# Patient Record
Sex: Male | Born: 1955 | Race: Black or African American | Hispanic: No | Marital: Single | Smoking: Never smoker
Health system: Southern US, Community
[De-identification: ages and names within clinical notes are randomized; demographics above are authoritative.]

## PROBLEM LIST (undated history)

## (undated) DIAGNOSIS — Z95810 Presence of automatic (implantable) cardiac defibrillator: Secondary | ICD-10-CM

---

## 2019-06-30 ENCOUNTER — Ambulatory Visit (INDEPENDENT_AMBULATORY_CARE_PROVIDER_SITE_OTHER): Payer: TRICARE Prime—HMO | Admitting: Clinical Cardiac Electrophysiology

## 2019-06-30 ENCOUNTER — Encounter (INDEPENDENT_AMBULATORY_CARE_PROVIDER_SITE_OTHER): Payer: Self-pay | Admitting: Clinical Cardiac Electrophysiology

## 2019-06-30 VITALS — BP 121/83 | HR 75 | Ht 73.0 in | Wt 250.0 lb

## 2019-06-30 DIAGNOSIS — I469 Cardiac arrest, cause unspecified: Secondary | ICD-10-CM | POA: Insufficient documentation

## 2019-06-30 DIAGNOSIS — I480 Paroxysmal atrial fibrillation: Secondary | ICD-10-CM

## 2019-06-30 DIAGNOSIS — Z79899 Other long term (current) drug therapy: Secondary | ICD-10-CM

## 2019-06-30 DIAGNOSIS — I4891 Unspecified atrial fibrillation: Secondary | ICD-10-CM | POA: Insufficient documentation

## 2019-06-30 DIAGNOSIS — I493 Ventricular premature depolarization: Secondary | ICD-10-CM

## 2019-06-30 DIAGNOSIS — I428 Other cardiomyopathies: Secondary | ICD-10-CM

## 2019-06-30 DIAGNOSIS — Z95810 Presence of automatic (implantable) cardiac defibrillator: Secondary | ICD-10-CM

## 2019-06-30 DIAGNOSIS — Z9229 Personal history of other drug therapy: Secondary | ICD-10-CM

## 2019-06-30 LAB — ECG 12-LEAD
Atrial Rate: 70 {beats}/min
P Axis: 88 degrees
P-R Interval: 198 ms
Q-T Interval: 408 ms
QRS Duration: 94 ms
QTC Calculation (Bezet): 440 ms
R Axis: 60 degrees
T Axis: 9 degrees
Ventricular Rate: 70 {beats}/min
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
## 2019-07-01 ENCOUNTER — Telehealth (INDEPENDENT_AMBULATORY_CARE_PROVIDER_SITE_OTHER): Payer: Self-pay | Admitting: Clinical Cardiac Electrophysiology

## 2019-07-01 DIAGNOSIS — Z79899 Other long term (current) drug therapy: Secondary | ICD-10-CM

## 2019-07-01 NOTE — Telephone Encounter (Signed)
Carollee Herter, please send patient order for PFT testing.

## 2019-07-01 NOTE — Telephone Encounter (Signed)
Placed order. Referral request in DL's bin to sign.

## 2019-07-01 NOTE — Telephone Encounter (Signed)
-----   Message from Tod Persia, MD sent at 06/30/2019  5:39 PM EDT -----  Shawna Orleans: Please send him request for PFTs, amiodarone surveillance  Teddy: please enroll him in remotes.  He is currently followed at [facility].

## 2019-07-01 NOTE — Progress Notes (Signed)
Forwarded it to [employer]. I have never ordered PFT testing.

## 2019-07-02 ENCOUNTER — Encounter (INDEPENDENT_AMBULATORY_CARE_PROVIDER_SITE_OTHER): Payer: Self-pay | Admitting: Clinical Cardiac Electrophysiology

## 2019-07-02 NOTE — Telephone Encounter (Signed)
Faxed and mailed PFT.

## 2019-07-03 ENCOUNTER — Encounter (INDEPENDENT_AMBULATORY_CARE_PROVIDER_SITE_OTHER): Payer: Self-pay

## 2019-07-03 NOTE — Progress Notes (Signed)
He saw Dr. Lujean Amel for a Consult on 06/30/19. I submitted a [facility] transfer request to [facility] today.

## 2019-07-07 ENCOUNTER — Encounter (INDEPENDENT_AMBULATORY_CARE_PROVIDER_SITE_OTHER): Payer: Self-pay | Admitting: Clinical Cardiac Electrophysiology

## 2019-07-11 NOTE — Progress Notes (Signed)
Spoke with the pm clinic and they said they'll accept the transfer.

## 2019-07-14 NOTE — Progress Notes (Signed)
Transfer accepted.

## 2019-09-27 ENCOUNTER — Encounter (INDEPENDENT_AMBULATORY_CARE_PROVIDER_SITE_OTHER): Payer: Self-pay | Admitting: Clinical Cardiac Electrophysiology

## 2019-10-06 ENCOUNTER — Ambulatory Visit (INDEPENDENT_AMBULATORY_CARE_PROVIDER_SITE_OTHER): Payer: TRICARE Prime—HMO | Admitting: Clinical Cardiac Electrophysiology

## 2019-11-20 ENCOUNTER — Ambulatory Visit (INDEPENDENT_AMBULATORY_CARE_PROVIDER_SITE_OTHER): Payer: TRICARE Prime—HMO | Admitting: Clinical Cardiac Electrophysiology

## 2019-12-08 ENCOUNTER — Encounter (INDEPENDENT_AMBULATORY_CARE_PROVIDER_SITE_OTHER): Payer: Self-pay | Admitting: Clinical Cardiac Electrophysiology

## 2019-12-25 ENCOUNTER — Telehealth (INDEPENDENT_AMBULATORY_CARE_PROVIDER_SITE_OTHER): Payer: TRICARE Prime—HMO | Admitting: Clinical Cardiac Electrophysiology

## 2019-12-25 ENCOUNTER — Encounter (INDEPENDENT_AMBULATORY_CARE_PROVIDER_SITE_OTHER): Payer: Self-pay | Admitting: Clinical Cardiac Electrophysiology

## 2019-12-25 VITALS — Wt 251.0 lb

## 2019-12-25 DIAGNOSIS — Z4502 Encounter for adjustment and management of automatic implantable cardiac defibrillator: Secondary | ICD-10-CM

## 2019-12-25 DIAGNOSIS — I469 Cardiac arrest, cause unspecified: Secondary | ICD-10-CM

## 2019-12-25 DIAGNOSIS — Z79899 Other long term (current) drug therapy: Secondary | ICD-10-CM

## 2019-12-25 DIAGNOSIS — Z95810 Presence of automatic (implantable) cardiac defibrillator: Secondary | ICD-10-CM

## 2019-12-25 DIAGNOSIS — Z9229 Personal history of other drug therapy: Secondary | ICD-10-CM

## 2019-12-25 DIAGNOSIS — I428 Other cardiomyopathies: Secondary | ICD-10-CM

## 2019-12-25 DIAGNOSIS — I480 Paroxysmal atrial fibrillation: Secondary | ICD-10-CM

## 2019-12-25 MED ORDER — AMIODARONE HCL 200 MG PO TABS
100.00 mg | ORAL_TABLET | Freq: Every day | ORAL | 3 refills | Status: AC
Start: 2019-12-25 — End: ?
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
## 2020-01-05 ENCOUNTER — Encounter (INDEPENDENT_AMBULATORY_CARE_PROVIDER_SITE_OTHER): Payer: Self-pay

## 2020-01-05 NOTE — Progress Notes (Signed)
Lab orders mailed to patient at address listed in chart

## 2020-01-19 ENCOUNTER — Other Ambulatory Visit (FREE_STANDING_LABORATORY_FACILITY): Payer: TRICARE Prime—HMO

## 2020-01-19 DIAGNOSIS — Z79899 Other long term (current) drug therapy: Secondary | ICD-10-CM

## 2020-01-19 LAB — COMPREHENSIVE METABOLIC PANEL
ALT: 33 U/L (ref 0–55)
AST (SGOT): 32 U/L (ref 5–34)
Albumin/Globulin Ratio: 1.2 (ref 0.9–2.2)
Albumin: 3.8 g/dL (ref 3.5–5.0)
Alkaline Phosphatase: 94 U/L (ref 38–106)
Anion Gap: 10 (ref 5.0–15.0)
BUN: 24 mg/dL (ref 9.0–28.0)
Bilirubin, Total: 0.8 mg/dL (ref 0.2–1.2)
CO2: 24 mEq/L (ref 21–29)
Calcium: 9.2 mg/dL (ref 8.5–10.5)
Chloride: 107 mEq/L (ref 100–111)
Creatinine: 1.4 mg/dL (ref 0.5–1.5)
Globulin: 3.3 g/dL (ref 2.0–3.7)
Glucose: 91 mg/dL (ref 70–100)
Potassium: 4.2 mEq/L (ref 3.5–5.1)
Protein, Total: 7.1 g/dL (ref 6.0–8.3)
Sodium: 141 mEq/L (ref 136–145)

## 2020-01-19 LAB — T4, FREE: T4 Free: 1.13 ng/dL (ref 0.70–1.48)

## 2020-01-19 LAB — TSH: TSH: 1.83 u[IU]/mL (ref 0.35–4.94)

## 2020-01-19 LAB — GFR: EGFR: 51.5

## 2020-01-19 LAB — HEMOLYSIS INDEX: Hemolysis Index: 18 (ref 0–18)

## 2020-01-23 ENCOUNTER — Telehealth (INDEPENDENT_AMBULATORY_CARE_PROVIDER_SITE_OTHER): Payer: Self-pay

## 2020-01-23 NOTE — Telephone Encounter (Signed)
Patient notified MW reviewed labs, wnl.

## 2020-01-23 NOTE — Telephone Encounter (Signed)
-----   Message from [facility], [place] sent at 01/21/2020 10:07 AM EST -----  On Amio, dose decreased 12/25/19 to 100 mg daily.

## 2020-03-30 ENCOUNTER — Encounter (INDEPENDENT_AMBULATORY_CARE_PROVIDER_SITE_OTHER): Payer: TRICARE Prime—HMO

## 2020-04-15 DIAGNOSIS — Z95810 Presence of automatic (implantable) cardiac defibrillator: Secondary | ICD-10-CM

## 2020-04-25 ENCOUNTER — Encounter (INDEPENDENT_AMBULATORY_CARE_PROVIDER_SITE_OTHER): Payer: Self-pay | Admitting: Cardiovascular Disease

## 2020-05-16 ENCOUNTER — Encounter (INDEPENDENT_AMBULATORY_CARE_PROVIDER_SITE_OTHER): Payer: Self-pay | Admitting: Clinical Cardiac Electrophysiology

## 2020-05-18 ENCOUNTER — Telehealth (INDEPENDENT_AMBULATORY_CARE_PROVIDER_SITE_OTHER): Payer: Self-pay

## 2020-05-18 NOTE — Telephone Encounter (Signed)
Pt Sean Mason wanting to let MW know that he was currently in ER at [facility] awaiting work up/triage for spike in his BP and L arm and L neck pain. His BP has normalized since his arrival. He will update [place] on any follow up or changes to plan of care recommended on discharge.     Sending to MW as Lorain Childes per patient request.

## 2020-05-19 NOTE — Telephone Encounter (Signed)
Remote download came through. He's only had 1 brief run of VT since his telemed visit on 12/25/19. He is having PVCs on the current EGM which is probably what he's feeling. Would we want to increase his Amio for this?    Since he was just in the ED for BP issues probably better to wait until his Aug office visit for any changes right?    See Murj.

## 2020-05-19 NOTE — Telephone Encounter (Signed)
5 beats nonsustained VT only.  Routine follow-up.  mw

## 2020-06-24 ENCOUNTER — Encounter (INDEPENDENT_AMBULATORY_CARE_PROVIDER_SITE_OTHER): Payer: TRICARE Prime—HMO | Admitting: Clinical Cardiac Electrophysiology

## 2020-07-15 DIAGNOSIS — Z95810 Presence of automatic (implantable) cardiac defibrillator: Secondary | ICD-10-CM

## 2020-07-24 ENCOUNTER — Encounter (INDEPENDENT_AMBULATORY_CARE_PROVIDER_SITE_OTHER): Payer: Self-pay | Admitting: Clinical Cardiac Electrophysiology

## 2020-07-30 ENCOUNTER — Other Ambulatory Visit (INDEPENDENT_AMBULATORY_CARE_PROVIDER_SITE_OTHER): Payer: Self-pay

## 2020-07-30 ENCOUNTER — Encounter (INDEPENDENT_AMBULATORY_CARE_PROVIDER_SITE_OTHER): Payer: Self-pay | Admitting: Clinical Cardiac Electrophysiology

## 2020-07-30 DIAGNOSIS — I480 Paroxysmal atrial fibrillation: Secondary | ICD-10-CM

## 2020-07-30 MED ORDER — CARVEDILOL 25 MG PO TABS
25.00 mg | ORAL_TABLET | Freq: Two times a day (BID) | ORAL | 3 refills | Status: AC
Start: 2020-07-30 — End: ?

## 2020-07-30 MED ORDER — RIVAROXABAN 20 MG PO TABS
20.0000 mg | ORAL_TABLET | Freq: Every day | ORAL | 3 refills | Status: AC
Start: 2020-07-30 — End: ?

## 2020-09-15 ENCOUNTER — Encounter (INDEPENDENT_AMBULATORY_CARE_PROVIDER_SITE_OTHER): Payer: TRICARE Prime—HMO | Admitting: Clinical Cardiac Electrophysiology

## 2021-01-14 ENCOUNTER — Telehealth (INDEPENDENT_AMBULATORY_CARE_PROVIDER_SITE_OTHER): Payer: Self-pay

## 2021-01-14 NOTE — Telephone Encounter (Signed)
Transfer-out accepted today.

## 2021-01-14 NOTE — Telephone Encounter (Signed)
LM that his carelink remotes are still coming to [place]. He cancelled his Nov 2021 office visit saying he is transferring his care to [facility]. [facility]. I instructed him to contact them so they can put in a carelink transfer request.

## 2021-09-02 ENCOUNTER — Other Ambulatory Visit: Payer: Self-pay

## 2021-09-02 ENCOUNTER — Observation Stay (HOSPITAL_COMMUNITY)
Admission: EM | Admit: 2021-09-02 | Discharge: 2021-09-06 | Disposition: A | Discharge status: Home / Self Care | Attending: Internal Medicine | Admitting: Internal Medicine

## 2021-09-02 ENCOUNTER — Emergency Department (HOSPITAL_COMMUNITY)

## 2021-09-02 ENCOUNTER — Encounter (HOSPITAL_COMMUNITY): Payer: Self-pay | Admitting: Emergency Medicine

## 2021-09-02 DIAGNOSIS — G459 Transient cerebral ischemic attack, unspecified: Principal | ICD-10-CM | POA: Insufficient documentation

## 2021-09-02 DIAGNOSIS — E785 Hyperlipidemia, unspecified: Secondary | ICD-10-CM

## 2021-09-02 DIAGNOSIS — Z950 Presence of cardiac pacemaker: Secondary | ICD-10-CM

## 2021-09-02 DIAGNOSIS — Z7901 Long term (current) use of anticoagulants: Secondary | ICD-10-CM | POA: Diagnosis not present

## 2021-09-02 DIAGNOSIS — Z79899 Other long term (current) drug therapy: Secondary | ICD-10-CM | POA: Diagnosis not present

## 2021-09-02 DIAGNOSIS — I10 Essential (primary) hypertension: Secondary | ICD-10-CM

## 2021-09-02 DIAGNOSIS — R531 Weakness: Secondary | ICD-10-CM | POA: Diagnosis present

## 2021-09-02 DIAGNOSIS — I5022 Chronic systolic (congestive) heart failure: Secondary | ICD-10-CM | POA: Diagnosis not present

## 2021-09-02 DIAGNOSIS — I110 Hypertensive heart disease with heart failure: Secondary | ICD-10-CM | POA: Diagnosis not present

## 2021-09-02 DIAGNOSIS — I480 Paroxysmal atrial fibrillation: Secondary | ICD-10-CM | POA: Insufficient documentation

## 2021-09-02 DIAGNOSIS — E119 Type 2 diabetes mellitus without complications: Secondary | ICD-10-CM | POA: Diagnosis not present

## 2021-09-02 DIAGNOSIS — Y900 Blood alcohol level of less than 20 mg/100 ml: Secondary | ICD-10-CM | POA: Insufficient documentation

## 2021-09-02 DIAGNOSIS — Z20822 Contact with and (suspected) exposure to covid-19: Secondary | ICD-10-CM | POA: Insufficient documentation

## 2021-09-02 HISTORY — DX: Presence of automatic (implantable) cardiac defibrillator: Z95.810

## 2021-09-02 LAB — URINALYSIS, ROUTINE W REFLEX MICROSCOPIC
Bacteria, UA: NONE SEEN
Bilirubin Urine: NEGATIVE
Glucose, UA: >=500 mg/dL — AB
Hgb urine dipstick: NEGATIVE
Ketones, ur: 5 mg/dL — AB
Leukocytes,Ua: NEGATIVE
Nitrite: NEGATIVE
Protein, ur: NEGATIVE mg/dL
Specific Gravity, Urine: 1.028 (ref 1.005–1.030)
pH: 5 (ref 5.0–8.0)

## 2021-09-02 LAB — CBC
HCT: 49.6 % (ref 39.0–52.0)
Hemoglobin: 16.3 g/dL (ref 13.0–17.0)
MCH: 30.1 pg (ref 26.0–34.0)
MCHC: 32.9 g/dL (ref 30.0–36.0)
MCV: 91.5 fL (ref 80.0–100.0)
Platelets: 171 10*3/uL (ref 150–400)
RBC: 5.42 MIL/uL (ref 4.22–5.81)
RDW: 12.3 % (ref 11.5–15.5)
WBC: 5.3 10*3/uL (ref 4.0–10.5)
nRBC: 0 % (ref 0.0–0.2)

## 2021-09-02 LAB — RAPID URINE DRUG SCREEN, HOSP PERFORMED
Amphetamines: NOT DETECTED
Barbiturates: NOT DETECTED
Benzodiazepines: NOT DETECTED
Cocaine: NOT DETECTED
Opiates: NOT DETECTED
Tetrahydrocannabinol: NOT DETECTED

## 2021-09-02 LAB — DIFFERENTIAL
Abs Immature Granulocytes: 0.01 10*3/uL (ref 0.00–0.07)
Basophils Absolute: 0 10*3/uL (ref 0.0–0.1)
Basophils Relative: 1 %
Eosinophils Absolute: 0.1 10*3/uL (ref 0.0–0.5)
Eosinophils Relative: 1 %
Immature Granulocytes: 0 %
Lymphocytes Relative: 33 %
Lymphs Abs: 1.8 10*3/uL (ref 0.7–4.0)
Monocytes Absolute: 0.6 10*3/uL (ref 0.1–1.0)
Monocytes Relative: 11 %
Neutro Abs: 2.9 10*3/uL (ref 1.7–7.7)
Neutrophils Relative %: 54 %

## 2021-09-02 LAB — COMPREHENSIVE METABOLIC PANEL
ALT: 30 U/L (ref 0–44)
AST: 25 U/L (ref 15–41)
Albumin: 3.7 g/dL (ref 3.5–5.0)
Alkaline Phosphatase: 73 U/L (ref 38–126)
Anion gap: 8 (ref 5–15)
BUN: 22 mg/dL (ref 8–23)
CO2: 25 mmol/L (ref 22–32)
Calcium: 9.9 mg/dL (ref 8.9–10.3)
Chloride: 104 mmol/L (ref 98–111)
Creatinine, Ser: 1.29 mg/dL — ABNORMAL HIGH (ref 0.61–1.24)
GFR, Estimated: >60 mL/min (ref >60)
Glucose, Bld: 107 mg/dL — ABNORMAL HIGH (ref 70–99)
Potassium: 4.2 mmol/L (ref 3.5–5.1)
Sodium: 137 mmol/L (ref 135–145)
Total Bilirubin: 1.3 mg/dL — ABNORMAL HIGH (ref 0.3–1.2)
Total Protein: 6.9 g/dL (ref 6.5–8.1)

## 2021-09-02 LAB — PROTIME-INR
INR: 1.3 — ABNORMAL HIGH (ref 0.8–1.2)
Prothrombin Time: 16.2 seconds — ABNORMAL HIGH (ref 11.4–15.2)

## 2021-09-02 LAB — I-STAT CHEM 8, ED
BUN: 24 mg/dL — ABNORMAL HIGH (ref 8–23)
Calcium, Ion: 1.24 mmol/L (ref 1.15–1.40)
Chloride: 105 mmol/L (ref 98–111)
Creatinine, Ser: 1.1 mg/dL (ref 0.61–1.24)
Glucose, Bld: 94 mg/dL (ref 70–99)
HCT: 49 % (ref 39.0–52.0)
Hemoglobin: 16.7 g/dL (ref 13.0–17.0)
Potassium: 4.1 mmol/L (ref 3.5–5.1)
Sodium: 140 mmol/L (ref 135–145)
TCO2: 26 mmol/L (ref 22–32)

## 2021-09-02 LAB — RESP PANEL BY RT-PCR (FLU A&B, COVID) ARPGX2
Influenza A by PCR: NEGATIVE
Influenza B by PCR: NEGATIVE
SARS Coronavirus 2 by RT PCR: NEGATIVE

## 2021-09-02 LAB — CBG MONITORING, ED: Glucose-Capillary: 109 mg/dL — ABNORMAL HIGH (ref 70–99)

## 2021-09-02 LAB — ETHANOL: Alcohol, Ethyl (B): <10 mg/dL (ref <10)

## 2021-09-02 LAB — APTT: aPTT: 28 seconds (ref 24–36)

## 2021-09-02 IMAGING — CT CT HEAD W/O CM
4 series · 16 of 47 positions shown, 18 images · non-contrast
Comparison: None.

CLINICAL DATA: Neuro deficit, acute, stroke suspected

Left-sided body weakness.  Confusion and dizziness.
EXAM:
CT HEAD WITHOUT CONTRAST
TECHNIQUE: Contiguous axial images were obtained from the base of the skull
through the vertex without intravenous contrast.

[Series 3: head without · axial · non-contrast · 0.47mm/px · z∈[-138,-8]mm · 7 of 36 slices shown, 9 images]
[im 5/36  brain]
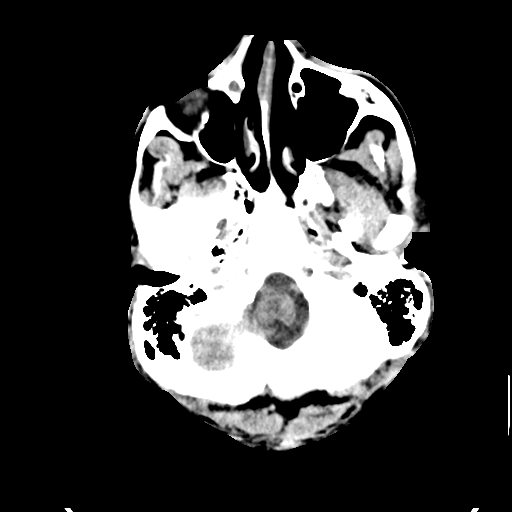
[im 5/36  bone]
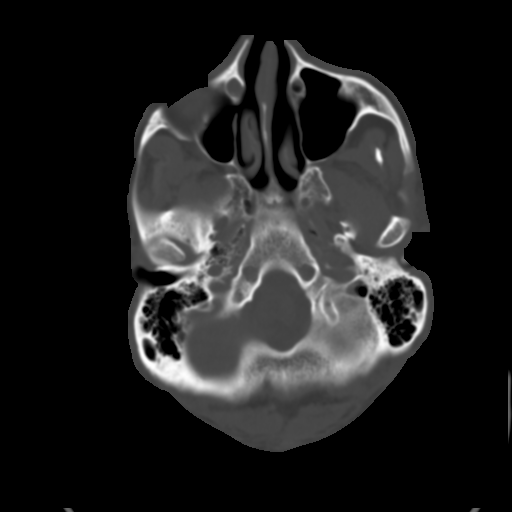
[im 9/36  brain]
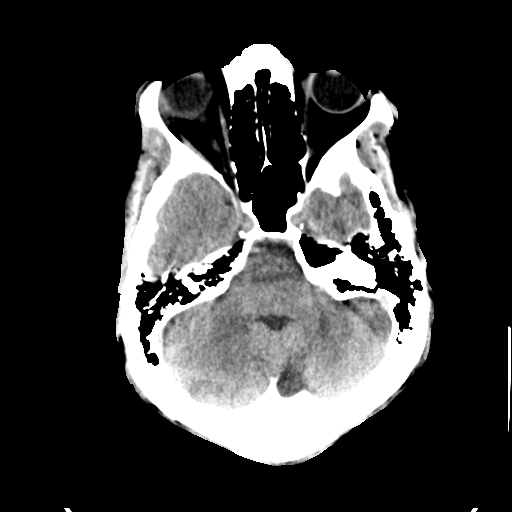
[im 14/36  brain]
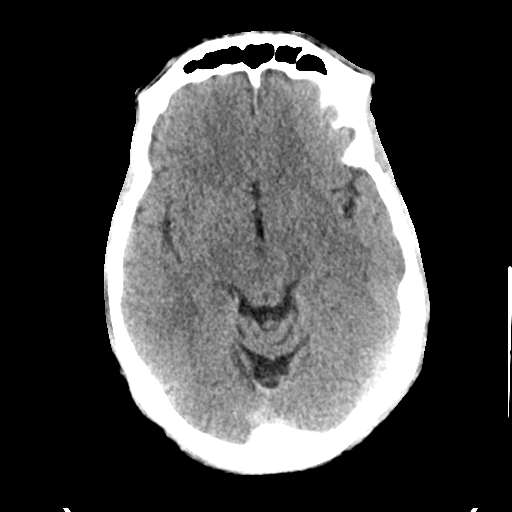
[im 18/36  brain]
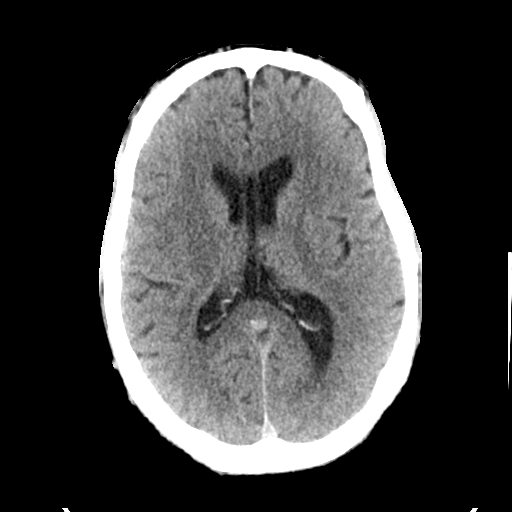
[im 22/36  brain]
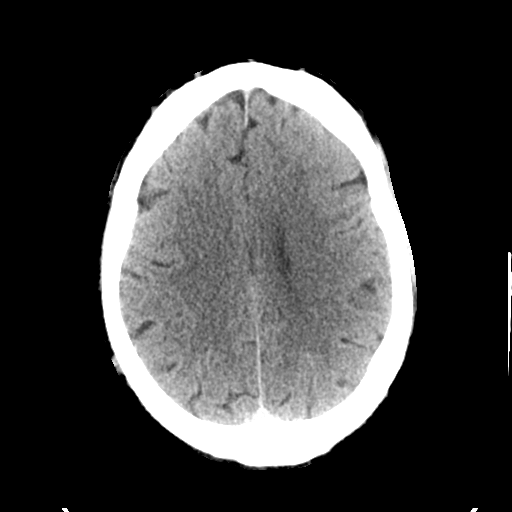
[im 22/36  bone]
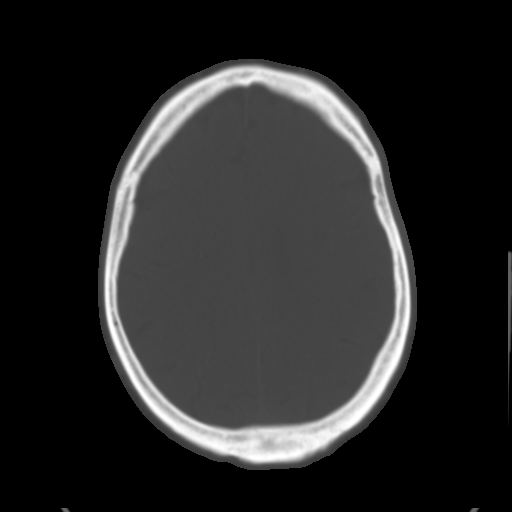
[im 27/36  brain]
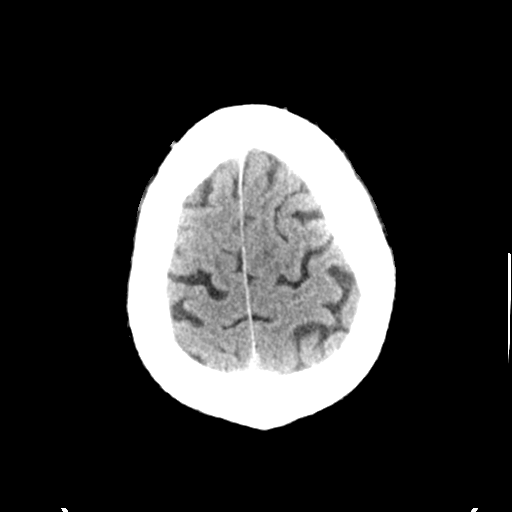
[im 31/36  brain]
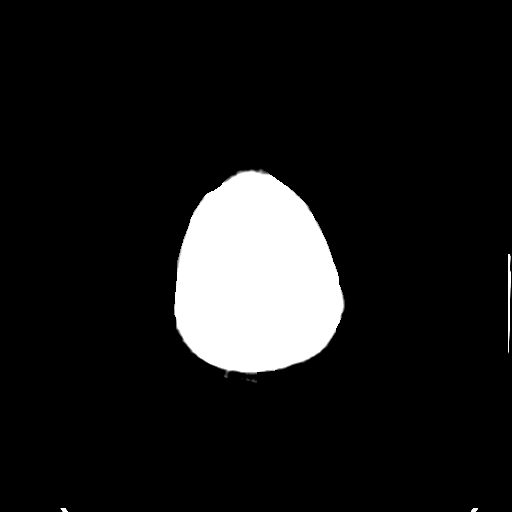

[Series 4: head bone · axial · 0.47mm/px · z∈[-142,-106]mm · 3 of 89 slices shown]
[im 9/89  bone]
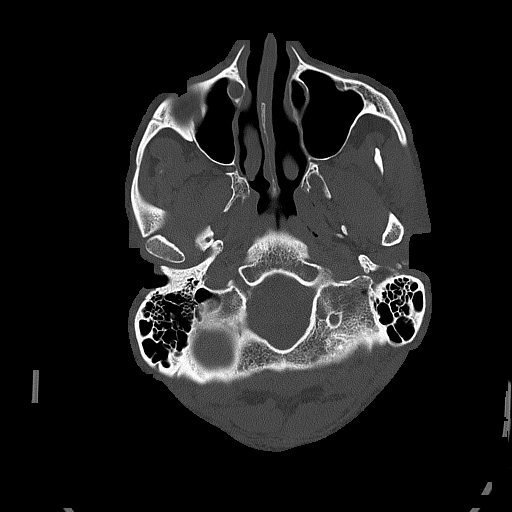
[im 18/89  bone]
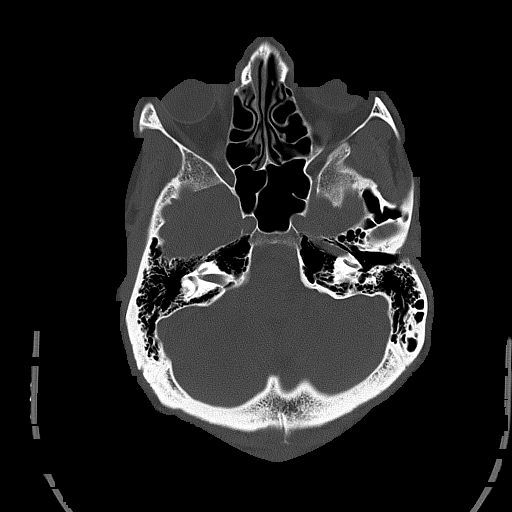
[im 27/89  bone]
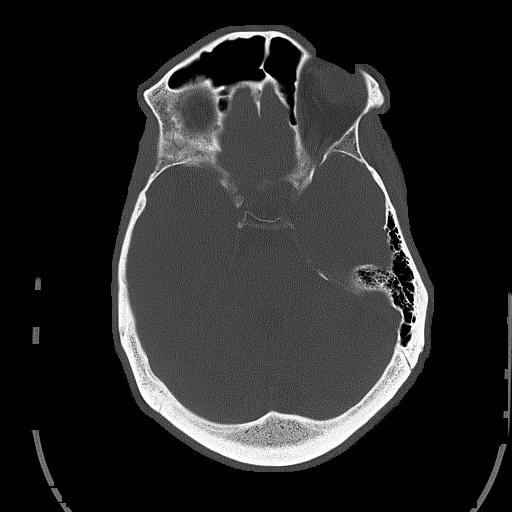

[Series 5: head without cor · coronal · non-contrast · 0.34mm/px · 3 of 80 slices shown]
[im 27/80  brain]
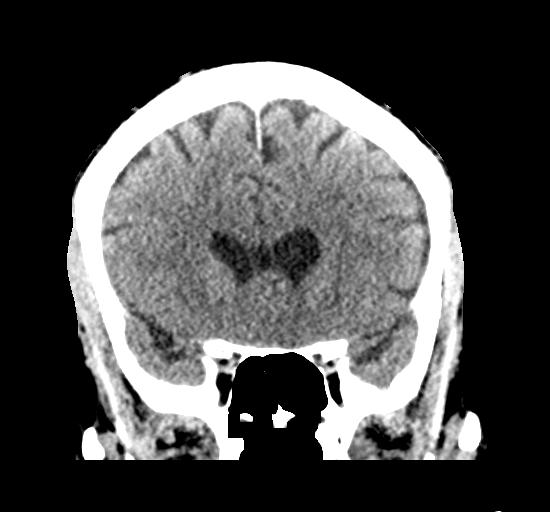
[im 36/80  brain]
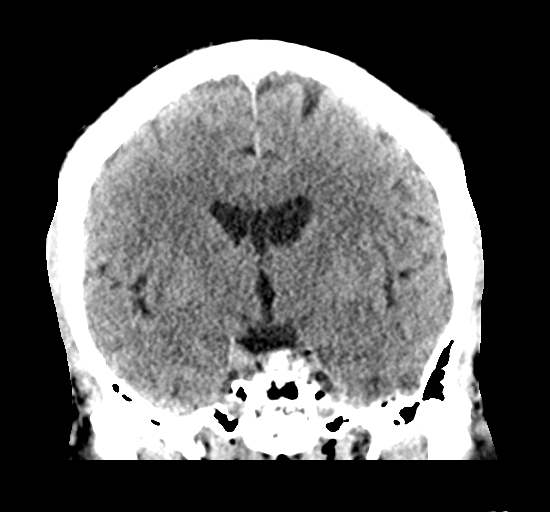
[im 44/80  brain]
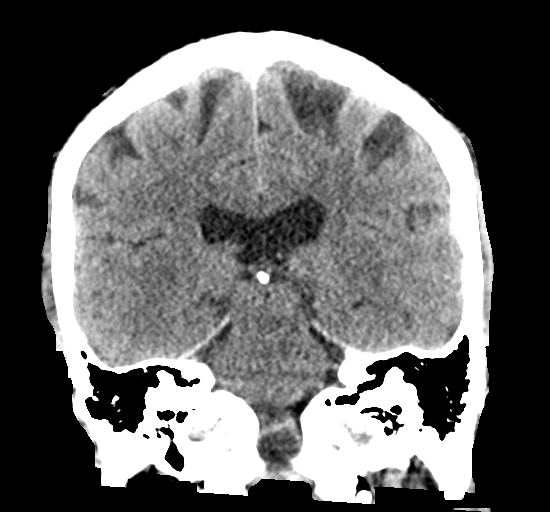

[Series 6: head without sag · sagittal · non-contrast · 0.34mm/px · 3 of 64 slices shown]
[im 22/64  brain]
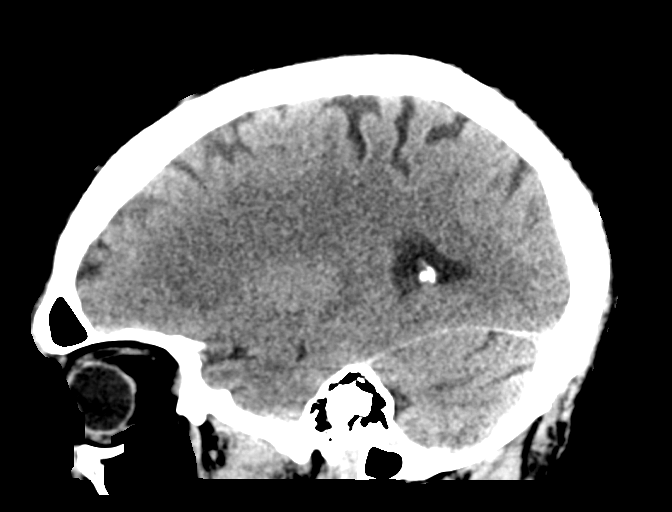
[im 32/64  brain]
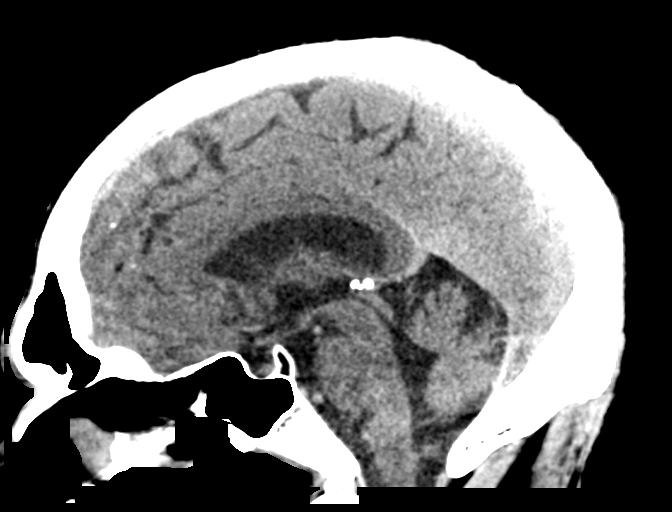
[im 43/64  brain]
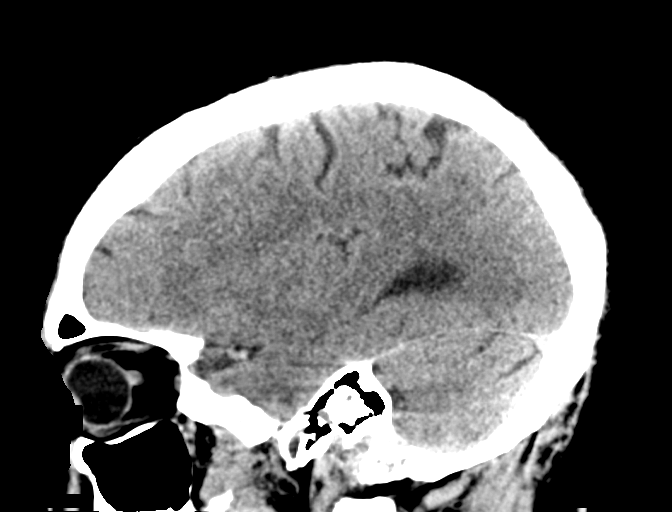

[16 of 47 positions shown; findings below may reference images not displayed]

FINDINGS: Brain: No intracranial hemorrhage, mass effect, or midline shift. No
hydrocephalus. Incidental cavum septum pellucidum, normal variant.
The basilar cisterns are patent. No evidence of territorial infarct
or acute ischemia. No extra-axial or intracranial fluid collection.

Vascular: No hyperdense vessel or unexpected calcification.

Skull: Normal. Negative for fracture or focal lesion.

Sinuses/Orbits: Paranasal sinuses and mastoid air cells are clear.
The visualized orbits are unremarkable.

Other: None.
IMPRESSION: Negative noncontrast head CT.

## 2021-09-02 MED ORDER — SODIUM CHLORIDE 0.9 % IV BOLUS
30.0000 mL/kg | Freq: Once | INTRAVENOUS | Status: AC
  Administered 2021-09-03: 3600 mL via INTRAVENOUS

## 2021-09-02 NOTE — H&P (Signed)
History and Physical    Bobby Griffin ZOX:[id number] DOB: 09-24-1956 DOA: 09/02/2021  PCP: Pcp, No Patient coming from: Home  Chief Complaint: Left-sided weakness  HPI: Bobby Griffin is a 65 y.o. male with medical history significant of nonischemic cardiomyopathy status post ICD implantation in 2017, paroxysmal A. 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  Symptom onset 1730 today.  Patient reported that his facial droop was chronic.  He did not have any other focal neurodeficits on exam.  Not a tPA candidate due to being on chronic anticoagulation.  Head CT negative for acute stroke.  Neurology recommended admitting the patient for TIA work-up.  Otherwise, vital signs stable.  Labs showing WBC 5.3, hemoglobin 16.3, platelet count 171k.  Sodium 137, potassium 4.2, chloride 104, bicarb 25, BUN 22, creatinine 1.2, glucose 107.  COVID and influenza PCR negative.  Blood ethanol level undetectable.  UA without signs of infection.  UDS negative.  Patient states he was driving from [place] to [place] and stopped at a store on the way around 5 or 5:30 PM.  All of a sudden he felt disoriented and his left arm felt limp.  He could not grip objects with his left hand and dropped items twice.  His left leg also felt slightly weak at that time.  He thinks his speech was also slightly slurred.  Symptoms started improving soon after and he was able to drive to [place].  Still having numbness in his left hand and having difficulty gripping objects.  He is ambidextrous.  Also the left side of his face feels numb.  Strength in his leg has improved, no numbness.  Speech has improved.  Denies history of prior stroke.  No other complaints.  Denies fevers, cough, shortness of breath, chest pain, nausea, vomiting, abdominal pain, or  diarrhea.  Review of Systems:  All systems reviewed and apart from history of presenting illness, are negative.  Past Medical History:  Diagnosis Date   ICD (implantable cardioverter-defibrillator) in place     History reviewed. No pertinent surgical history.   reports that he has never smoked. He has never used smokeless tobacco. He reports that he does not drink alcohol and does not use drugs.  Allergies  Allergen Reactions   Penicillins     History reviewed. No pertinent family history.  Prior to Admission medications   Medication Sig Start Date End Date Taking? Authorizing Provider  carvedilol (COREG) 25 MG tablet Take 12.5 mg by mouth in the morning and at bedtime. 01/14/20 09/28/21 Yes [provider]  diclofenac Sodium (VOLTAREN) 1 % GEL Apply 2-4 g topically every 4 (four) hours as needed (pain). 06/30/21 06/30/22 Yes [provider]  empagliflozin (JARDIANCE) 10 MG TABS tablet Take 10 mg by mouth in the morning. 06/30/21 06/29/22 Yes [provider]  eplerenone (INSPRA) 25 MG tablet Take 12.5 mg by mouth daily. 01/14/20 06/29/22 Yes [provider]  glucosamine-chondroitin 500-400 MG tablet Take 2 tablets by mouth every evening.   Yes [provider]  Multiple Vitamins-Minerals (MULTIVITAMIN WITH MINERALS) tablet Take 1 tablet by mouth daily.   Yes [provider]  pravastatin (PRAVACHOL) 20 MG tablet Take 20 mg by mouth at bedtime. 06/30/21 06/29/22 Yes [provider]  rivaroxaban (XARELTO) 20 MG TABS tablet Take 20 mg by mouth every evening. 01/14/20 06/29/22 Yes [provider]  sacubitril-valsartan (ENTRESTO) 24-26 MG  Take 1 tablet by mouth 2 (two) times daily. 01/14/20 06/29/22 Yes [provider]  Saline (OCEAN NASAL SPRAY NA) Place 1 spray into both nostrils daily as needed (congestion).   Yes [provider]  sildenafil (VIAGRA) 25 MG tablet Take 25 mg by mouth daily as needed for erectile  dysfunction. 07/05/21 06/30/22 Yes [provider]  tamsulosin (FLOMAX) 0.4 MG CAPS capsule Take 1 capsule by mouth every evening. 01/14/20 10/11/21 Yes [provider]    Physical Exam: Vitals:   09/02/21 2345 09/03/21 0015 09/03/21 0400 09/03/21 0557  BP: 107/87 116/88 103/63 98/71  Pulse: 69 68 77 68  Resp: 17 17 16 19   Temp:   98.5 F (36.9 C)   TempSrc:   Oral   SpO2: 98% 98% 96% 96%  Weight:   112.5 kg   Height:   6\' 1"  (1.854 m)     Physical Exam Constitutional:      General: He is not in acute distress. HENT:     Head: Normocephalic and atraumatic.  Eyes:     Extraocular Movements: Extraocular movements intact.     Conjunctiva/sclera: Conjunctivae normal.  Cardiovascular:     Rate and Rhythm: Normal rate and regular rhythm.     Pulses: Normal pulses.  Pulmonary:     Effort: Pulmonary effort is normal. No respiratory distress.     Breath sounds: Normal breath sounds. No wheezing or rales.  Abdominal:     General: Bowel sounds are normal. There is no distension.     Palpations: Abdomen is soft.     Tenderness: There is no abdominal tenderness.  Musculoskeletal:        General: No swelling or tenderness.     Cervical back: Normal range of motion and neck supple.  Skin:    General: Skin is warm and dry.  Neurological:     Mental Status: He is alert and oriented to person, place, and time.     Comments: Facial asymmetry noted which is chronic per patient Speech fluent Decreased sensation on the left side of his face below forehead Decreased grip strength and sensation in the left hand He is able to raise both arms up against gravity, no pronator drift noted Strength 5 out of 5 in bilateral lower extremities and sensation intact.     Labs on Admission: I have personally reviewed following labs and imaging studies  CBC: Recent Labs  Lab 09/02/21 2153 09/02/21 2212  WBC 5.3  --   NEUTROABS 2.9  --   HGB 16.3 16.7  HCT 49.6 49.0  MCV 91.5  --    PLT 171  --    Basic Metabolic Panel: Recent Labs  Lab 09/02/21 2153 09/02/21 2212  NA 137 140  K 4.2 4.1  CL 104 105  CO2 25  --   GLUCOSE 107* 94  BUN 22 24*  CREATININE 1.29* 1.10  CALCIUM 9.9  --    GFR: Estimated Creatinine Clearance: 91.5 mL/min (by C-G formula based on SCr of 1.1 mg/dL). Liver Function Tests: Recent Labs  Lab 09/02/21 2153  AST 25  ALT 30  ALKPHOS 73  BILITOT 1.3*  PROT 6.9  ALBUMIN 3.7   No results for input(s): LIPASE, AMYLASE in the last 168 hours. No results for input(s): AMMONIA in the last 168 hours. Coagulation Profile: Recent Labs  Lab 09/02/21 2153  INR 1.3*   Cardiac Enzymes: No results for input(s): CKTOTAL, CKMB, CKMBINDEX, TROPONINI in the last 168 hours. BNP (last  3 results) No results for input(s): PROBNP in the last 8760 hours. HbA1C: Recent Labs    09/03/21 0240  HGBA1C 5.2   CBG: Recent Labs  Lab 09/02/21 2130 09/03/21 0703  GLUCAP 109* 133*   Lipid Profile: Recent Labs    09/03/21 0240  CHOL 142  HDL 42  LDLCALC 92  TRIG 40  CHOLHDL 3.4   Thyroid Function Tests: No results for input(s): TSH, T4TOTAL, FREET4, T3FREE, THYROIDAB in the last 72 hours. Anemia Panel: No results for input(s): VITAMINB12, FOLATE, FERRITIN, TIBC, IRON, RETICCTPCT in the last 72 hours. Urine analysis:    Component Value Date/Time   COLORURINE YELLOW 09/02/2021 2201   APPEARANCEUR CLEAR 09/02/2021 2201   LABSPEC 1.028 09/02/2021 2201   PHURINE 5.0 09/02/2021 2201   GLUCOSEU >=500 (A) 09/02/2021 2201   HGBUR NEGATIVE 09/02/2021 2201   BILIRUBINUR NEGATIVE 09/02/2021 2201   KETONESUR 5 (A) 09/02/2021 2201   PROTEINUR NEGATIVE 09/02/2021 2201   NITRITE NEGATIVE 09/02/2021 2201   LEUKOCYTESUR NEGATIVE 09/02/2021 2201    Radiological Exams on Admission: CT HEAD WO CONTRAST  Result Date: 09/02/2021 CLINICAL DATA:  Neuro deficit, acute, stroke suspected Left-sided body weakness.  Confusion and dizziness. EXAM: CT HEAD  WITHOUT CONTRAST TECHNIQUE: Contiguous axial images were obtained from the base of the skull through the vertex without intravenous contrast. COMPARISON:  None. FINDINGS: Brain: No intracranial hemorrhage, mass effect, or midline shift. No hydrocephalus. Incidental cavum septum pellucidum, normal variant. The basilar cisterns are patent. No evidence of territorial infarct or acute ischemia. No extra-axial or intracranial fluid collection. Vascular: No hyperdense vessel or unexpected calcification. Skull: Normal. Negative for fracture or focal lesion. Sinuses/Orbits: Paranasal sinuses and mastoid air cells are clear. The visualized orbits are unremarkable. Other: None. IMPRESSION: Negative noncontrast head CT. Electronically Signed   By: Narda Rutherford M.D.   On: 09/02/2021 22:26    EKG: Independently reviewed.  Sinus rhythm with first-degree AV block, PVCs, borderline T wave abnormalities in lateral leads.  No prior tracing for comparison.  Assessment/Plan Principal Problem:   TIA (transient ischemic attack) Active Problems:   Chronic systolic CHF (congestive heart failure) (HCC)   AF (paroxysmal atrial fibrillation) (HCC)   Essential hypertension   Hyperlipidemia   TIA versus possible stroke Head CT negative.  A1c 5.2.  Currently on low intensity statin, LDL 92. -Neurology consulted, appreciate recommendations -Telemetry monitoring -MRI of the brain without contrast -MRA head and neck -TTE -Discontinue pravastatin.  Start atorvastatin 40 mg daily. -Neurology recommending continuing Xarelto -SBP goal <180 -Frequent neurochecks -PT, OT, speech therapy. -N.p.o. until cleared by bedside swallow evaluation or formal speech evaluation.  Chronic systolic CHF/ nonischemic cardiomyopathy Status post ICD implantation in 2017.  Echo done June 2020 showing EF 30-35%.  No signs of volume overload at this time. -Hold Coreg at this time as blood pressure is soft  Paroxysmal A. fib/flutter Stable.   Currently in sinus rhythm. -Hold Coreg at this time as blood pressure is soft.  Continue Xarelto.  EKG abnormality, elevated troponin EKG showing borderline T wave abnormalities in lateral leads, no prior tracing for comparison.  High-sensitivity troponin 25.  ACS less likely as patient is not endorsing any chest pain. -Trend troponin  Hypertension -Hold antihypertensives at this time as blood pressure soft  Hyperlipidemia -Started on high intensity statin  Non-insulin-dependent type 2 diabetes Well-controlled-A1c 5.2. 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  No family at bedside. Disposition Plan: Status is: Observation  The patient remains OBS appropriate and will d/c before 2 midnights.  Level of care: Level of care: Telemetry Medical  The medical decision making on this patient was of high complexity and the patient is at high risk for clinical deterioration, therefore this is a level 3 visit.  John Giovanni MD [facility]  If 7PM-7AM, please contact night-coverage [email or web address]  09/03/2021, 8:00 AM

## 2021-09-02 NOTE — ED Provider Notes (Addendum)
[facility] EMERGENCY DEPARTMENT Provider Note   CSN: [id number] Arrival date & time: 09/02/21  1938     History Chief Complaint  Patient presents with   Left Side Body Weakness    Bobby Griffin is a 65 y.o. male.  The history is provided by the patient.  Extremity Weakness This is a new problem. The current episode started 12 to 24 hours ago. The problem occurs constantly. The problem has not changed since onset.Pertinent negatives include no chest pain and no shortness of breath. Nothing aggravates the symptoms. Nothing relieves the symptoms. He has tried nothing for the symptoms. The treatment provided no relief.  Driving from [place] to [place] and became dizzy confused facial weakness and LUE weakness.  Some changes in speech.      Past Medical History:  Diagnosis Date   ICD (implantable cardioverter-defibrillator) in place     There are no problems to display for this patient.   History reviewed. No pertinent surgical history.     History reviewed. No pertinent family history.  Social History   Tobacco Use   Smoking status: Never   Smokeless tobacco: Never  Substance Use Topics   Alcohol use: Never   Drug use: Never    Home Medications Prior to Admission medications   Not on File    Allergies    Penicillins  Review of Systems   Review of Systems  Constitutional:  Negative for fever.  HENT:  Negative for facial swelling.   Eyes:  Negative for photophobia.  Respiratory:  Negative for shortness of breath, wheezing and stridor.   Cardiovascular:  Negative for chest pain.  Gastrointestinal:  Negative for nausea and vomiting.  Genitourinary:  Negative for difficulty urinating.  Musculoskeletal:  Positive for extremity weakness. Negative for neck stiffness.  Skin:  Negative for rash.  Neurological:  Positive for weakness.  Psychiatric/Behavioral:  Positive for confusion.   All other systems reviewed and are negative.  Physical Exam Updated  Vital Signs BP 107/87 (BP Location: Right Arm)   Pulse 75   Temp 98.7 F (37.1 C) (Oral)   Resp 16   Ht 6\' 1"  (1.854 m)   Wt 120 kg   SpO2 97%   BMI 34.90 kg/m   Physical Exam Vitals and nursing note reviewed.  Constitutional:      General: He is not in acute distress.    Appearance: Normal appearance.  HENT:     Head: Normocephalic and atraumatic.  Eyes:     Conjunctiva/sclera: Conjunctivae normal.     Pupils: Pupils are equal, round, and reactive to light.  Cardiovascular:     Rate and Rhythm: Normal rate and regular rhythm.     Pulses: Normal pulses.     Heart sounds: Normal heart sounds.  Pulmonary:     Effort: Pulmonary effort is normal.     Breath sounds: Normal breath sounds.  Abdominal:     General: Abdomen is flat. Bowel sounds are normal.     Palpations: Abdomen is soft.     Tenderness: There is no abdominal tenderness. There is no guarding or rebound.  Musculoskeletal:        General: Normal range of motion.     Cervical back: Normal range of motion and neck supple.  Skin:    General: Skin is warm and dry.     Capillary Refill: Capillary refill takes less than 2 seconds.  Neurological:     Mental Status: He is alert and oriented to person,  place, and time.     Deep Tendon Reflexes: Reflexes normal.     Comments: Facial asymmetry  Psychiatric:        Mood and Affect: Mood normal.        Behavior: Behavior normal.    ED Results / Procedures / Treatments   Labs (all labs ordered are listed, but only abnormal results are displayed) Results for orders placed or performed during the hospital encounter of 09/02/21  Resp Panel by RT-PCR (Flu A&B, Covid) Nasopharyngeal Swab   Specimen: Nasopharyngeal Swab; Nasopharyngeal(NP) swabs in vial transport medium  Result Value Ref Range   SARS Coronavirus 2 by RT PCR NEGATIVE NEGATIVE   Influenza A by PCR NEGATIVE NEGATIVE   Influenza B by PCR NEGATIVE NEGATIVE  Ethanol  Result Value Ref Range   Alcohol, Ethyl  (B) <10 <10 mg/dL  Protime-INR  Result Value Ref Range   Prothrombin Time 16.2 (H) 11.4 - 15.2 seconds   INR 1.3 (H) 0.8 - 1.2  APTT  Result Value Ref Range   aPTT 28 24 - 36 seconds  CBC  Result Value Ref Range   WBC 5.3 4.0 - 10.5 K/uL   RBC 5.42 4.22 - 5.81 MIL/uL   Hemoglobin 16.3 13.0 - 17.0 g/dL   HCT 40.9 81.1 - 91.4 %   MCV 91.5 80.0 - 100.0 fL   MCH 30.1 26.0 - 34.0 pg   MCHC 32.9 30.0 - 36.0 g/dL   RDW 78.2 95.6 - 21.3 %   Platelets 171 150 - 400 K/uL   nRBC 0.0 0.0 - 0.2 %  Differential  Result Value Ref Range   Neutrophils Relative % 54 %   Neutro Abs 2.9 1.7 - 7.7 K/uL   Lymphocytes Relative 33 %   Lymphs Abs 1.8 0.7 - 4.0 K/uL   Monocytes Relative 11 %   Monocytes Absolute 0.6 0.1 - 1.0 K/uL   Eosinophils Relative 1 %   Eosinophils Absolute 0.1 0.0 - 0.5 K/uL   Basophils Relative 1 %   Basophils Absolute 0.0 0.0 - 0.1 K/uL   Immature Granulocytes 0 %   Abs Immature Granulocytes 0.01 0.00 - 0.07 K/uL  Comprehensive metabolic panel  Result Value Ref Range   Sodium 137 135 - 145 mmol/L   Potassium 4.2 3.5 - 5.1 mmol/L   Chloride 104 98 - 111 mmol/L   CO2 25 22 - 32 mmol/L   Glucose, Bld 107 (H) 70 - 99 mg/dL   BUN 22 8 - 23 mg/dL   Creatinine, Ser 0.86 (H) 0.61 - 1.24 mg/dL   Calcium 9.9 8.9 - 57.8 mg/dL   Total Protein 6.9 6.5 - 8.1 g/dL   Albumin 3.7 3.5 - 5.0 g/dL   AST 25 15 - 41 U/L   ALT 30 0 - 44 U/L   Alkaline Phosphatase 73 38 - 126 U/L   Total Bilirubin 1.3 (H) 0.3 - 1.2 mg/dL   GFR, Estimated >46 >96 mL/min   Anion gap 8 5 - 15  Urine rapid drug screen (hosp performed)  Result Value Ref Range   Opiates NONE DETECTED NONE DETECTED   Cocaine NONE DETECTED NONE DETECTED   Benzodiazepines NONE DETECTED NONE DETECTED   Amphetamines NONE DETECTED NONE DETECTED   Tetrahydrocannabinol NONE DETECTED NONE DETECTED   Barbiturates NONE DETECTED NONE DETECTED  Urinalysis, Routine w reflex microscopic Urine, Clean Catch  Result Value Ref Range    Color, Urine YELLOW YELLOW   APPearance CLEAR CLEAR   Specific Gravity,  Urine 1.028 1.005 - 1.030   pH 5.0 5.0 - 8.0   Glucose, UA >=500 (A) NEGATIVE mg/dL   Hgb urine dipstick NEGATIVE NEGATIVE   Bilirubin Urine NEGATIVE NEGATIVE   Ketones, ur 5 (A) NEGATIVE mg/dL   Protein, ur NEGATIVE NEGATIVE mg/dL   Nitrite NEGATIVE NEGATIVE   Leukocytes,Ua NEGATIVE NEGATIVE   RBC / HPF 0-5 0 - 5 RBC/hpf   WBC, UA 0-5 0 - 5 WBC/hpf   Bacteria, UA NONE SEEN NONE SEEN  CBG monitoring, ED  Result Value Ref Range   Glucose-Capillary 109 (H) 70 - 99 mg/dL  I-stat chem 8, ED  Result Value Ref Range   Sodium 140 135 - 145 mmol/L   Potassium 4.1 3.5 - 5.1 mmol/L   Chloride 105 98 - 111 mmol/L   BUN 24 (H) 8 - 23 mg/dL   Creatinine, Ser 7.90 0.61 - 1.24 mg/dL   Glucose, Bld 94 70 - 99 mg/dL   Calcium, Ion 2.40 9.73 - 1.40 mmol/L   TCO2 26 22 - 32 mmol/L   Hemoglobin 16.7 13.0 - 17.0 g/dL   HCT 53.2 99.2 - 42.6 %   CT HEAD WO CONTRAST  Result Date: 09/02/2021 CLINICAL DATA:  Neuro deficit, acute, stroke suspected Left-sided body weakness.  Confusion and dizziness. EXAM: CT HEAD WITHOUT CONTRAST TECHNIQUE: Contiguous axial images were obtained from the base of the skull through the vertex without intravenous contrast. COMPARISON:  None. FINDINGS: Brain: No intracranial hemorrhage, mass effect, or midline shift. No hydrocephalus. Incidental cavum septum pellucidum, normal variant. The basilar cisterns are patent. No evidence of territorial infarct or acute ischemia. No extra-axial or intracranial fluid collection. Vascular: No hyperdense vessel or unexpected calcification. Skull: Normal. Negative for fracture or focal lesion. Sinuses/Orbits: Paranasal sinuses and mastoid air cells are clear. The visualized orbits are unremarkable. Other: None. IMPRESSION: Negative noncontrast head CT. Electronically Signed   By: Narda Rutherford M.D.   On: 09/02/2021 22:26     EKG None  Radiology CT HEAD WO  CONTRAST  Result Date: 09/02/2021 CLINICAL DATA:  Neuro deficit, acute, stroke suspected Left-sided body weakness.  Confusion and dizziness. EXAM: CT HEAD WITHOUT CONTRAST TECHNIQUE: Contiguous axial images were obtained from the base of the skull through the vertex without intravenous contrast. COMPARISON:  None. FINDINGS: Brain: No intracranial hemorrhage, mass effect, or midline shift. No hydrocephalus. Incidental cavum septum pellucidum, normal variant. The basilar cisterns are patent. No evidence of territorial infarct or acute ischemia. No extra-axial or intracranial fluid collection. Vascular: No hyperdense vessel or unexpected calcification. Skull: Normal. Negative for fracture or focal lesion. Sinuses/Orbits: Paranasal sinuses and mastoid air cells are clear. The visualized orbits are unremarkable. Other: None. IMPRESSION: Negative noncontrast head CT. Electronically Signed   By: Narda Rutherford M.D.   On: 09/02/2021 22:26    Procedures Procedures   Medications Ordered in ED Medications - No data to display  ED Course  I have reviewed the triage vital signs and the nursing notes.  Pertinent labs & imaging results that were available during my care of the patient were reviewed by me and considered in my medical decision making (see chart for details).    MDM Rules/Calculators/A&P                           Case d/w Dr. Thomasena Edis admit to medicine  Final Clinical Impression(s) / ED Diagnoses Final diagnoses:  None    Rx / DC Orders ED Discharge  Orders     None        Criston Chancellor, MD 09/02/21 5456    Nicanor Alcon, Nayali Talerico, MD 09/03/21 2563

## 2021-09-02 NOTE — ED Provider Notes (Signed)
HeEmergency Medicine Provider Triage Evaluation Note  Bobby Griffin , a 65 y.o. male  was evaluated in triage.  Pt complains of left sided weakness.  He felt like he had a dizzy spell and realized he couldn't move his left arm.  He feels like his leg may have been weak also.  He feels like his left arm strength is improved however he is still weak.  He denies any history of similar.  He notes that he has chronic facial droop that is unchanged.  States that this happened at about 530 did recently miss a few doses of his anticoagulant as he donated blood.  He denies any fevers or trauma.  No chest pain.  He has a defibrillator and has not had any shocks recently.  Review of Systems  Positive: Left sided weakness.  Negative: Headache, chest pain  Physical Exam  BP (!) 130/92 (BP Location: Right Arm)   Pulse 71   Temp 98.3 F (36.8 C) (Oral)   Resp 18   Ht 6\' 1"  (1.854 m)   Wt 120 kg   SpO2 97%   BMI 34.90 kg/m  Gen:   Awake, no distress   Resp:  Normal effort  MSK:   Mild left sided weakness with slight pronator drift. Left leg is slightly weak when compared to right.   Other:  Facial droop at baseline per patient, decreased subjective sensation to light touch to left hand.   Medical Decision Making  Medically screening exam initiated at 9:33 PM.  Appropriate orders placed.  Bobby Griffin was informed that the remainder of the evaluation will be completed by another provider, this initial triage assessment does not replace that evaluation, and the importance of remaining in the ED until their evaluation is complete.  Patient is outside TPA window, and is anticoagulated which would disqualify him from [facility]ytics.  He doesn't have LVO criteria.  Will do labs and CT head.   Note: Portions of this report may have been transcribed using voice recognition software. Every effort was made to ensure accuracy; however, inadvertent computerized transcription errors may be present    Fredric Mare 09/02/21 2151    2152, MD 09/02/21 2302

## 2021-09-02 NOTE — ED Triage Notes (Addendum)
Patient arrived with EMS from home reports brief left sided body weakness this afternoon (1730) completely resolved prior to arrival . CBG=120.

## 2021-09-03 ENCOUNTER — Encounter (HOSPITAL_COMMUNITY)

## 2021-09-03 ENCOUNTER — Observation Stay (HOSPITAL_BASED_OUTPATIENT_CLINIC_OR_DEPARTMENT_OTHER)

## 2021-09-03 ENCOUNTER — Other Ambulatory Visit (HOSPITAL_COMMUNITY)

## 2021-09-03 DIAGNOSIS — I639 Cerebral infarction, unspecified: Secondary | ICD-10-CM | POA: Diagnosis not present

## 2021-09-03 DIAGNOSIS — G459 Transient cerebral ischemic attack, unspecified: Secondary | ICD-10-CM

## 2021-09-03 DIAGNOSIS — I10 Essential (primary) hypertension: Secondary | ICD-10-CM | POA: Diagnosis not present

## 2021-09-03 DIAGNOSIS — I5022 Chronic systolic (congestive) heart failure: Secondary | ICD-10-CM

## 2021-09-03 DIAGNOSIS — Z86711 Personal history of pulmonary embolism: Secondary | ICD-10-CM

## 2021-09-03 DIAGNOSIS — I480 Paroxysmal atrial fibrillation: Secondary | ICD-10-CM | POA: Diagnosis not present

## 2021-09-03 DIAGNOSIS — E785 Hyperlipidemia, unspecified: Secondary | ICD-10-CM

## 2021-09-03 DIAGNOSIS — R9430 Abnormal result of cardiovascular function study, unspecified: Secondary | ICD-10-CM

## 2021-09-03 DIAGNOSIS — I2699 Other pulmonary embolism without acute cor pulmonale: Secondary | ICD-10-CM | POA: Diagnosis not present

## 2021-09-03 DIAGNOSIS — Z7901 Long term (current) use of anticoagulants: Secondary | ICD-10-CM

## 2021-09-03 DIAGNOSIS — Z86718 Personal history of other venous thrombosis and embolism: Secondary | ICD-10-CM

## 2021-09-03 DIAGNOSIS — E782 Mixed hyperlipidemia: Secondary | ICD-10-CM

## 2021-09-03 LAB — GLUCOSE, CAPILLARY
Glucose-Capillary: 133 mg/dL — ABNORMAL HIGH (ref 70–99)
Glucose-Capillary: 150 mg/dL — ABNORMAL HIGH (ref 70–99)

## 2021-09-03 LAB — ECHOCARDIOGRAM COMPLETE
AR max vel: 3.06 cm2
AV Area VTI: 2.7 cm2
AV Area mean vel: 2.92 cm2
AV Mean grad: 2 mmHg
AV Peak grad: 3.5 mmHg
Ao pk vel: 0.93 m/s
Area-P 1/2: 2.62 cm2
Calc EF: 49.4 %
Height: 73 in
S' Lateral: 4.3 cm
Single Plane A2C EF: 48.8 %
Single Plane A4C EF: 44 %
Weight: 3968.28 oz

## 2021-09-03 LAB — LIPID PANEL
Cholesterol: 142 mg/dL (ref 0–200)
HDL: 42 mg/dL (ref >40)
LDL Cholesterol: 92 mg/dL (ref 0–99)
Total CHOL/HDL Ratio: 3.4 RATIO
Triglycerides: 40 mg/dL (ref <150)
VLDL: 8 mg/dL (ref 0–40)

## 2021-09-03 LAB — TROPONIN I (HIGH SENSITIVITY)
Troponin I (High Sensitivity): 25 ng/L — ABNORMAL HIGH (ref <18)
Troponin I (High Sensitivity): 21 ng/L — ABNORMAL HIGH (ref <18)

## 2021-09-03 LAB — HIV ANTIBODY (ROUTINE TESTING W REFLEX): HIV Screen 4th Generation wRfx: NONREACTIVE

## 2021-09-03 LAB — HEMOGLOBIN A1C
Hgb A1c MFr Bld: 5.2 % (ref 4.8–5.6)
Mean Plasma Glucose: 102.54 mg/dL

## 2021-09-03 MED ORDER — TAMSULOSIN HCL 0.4 MG PO CAPS
0.4000 mg | ORAL_CAPSULE | Freq: Every evening | ORAL | Status: DC
  Administered 2021-09-03 – 2021-09-05 (×3): 0.4 mg via ORAL
  Filled 2021-09-03 (×3): qty 1

## 2021-09-03 MED ORDER — RIVAROXABAN 20 MG PO TABS
20.0000 mg | ORAL_TABLET | Freq: Every evening | ORAL | Status: DC
  Administered 2021-09-03 – 2021-09-05 (×3): 20 mg via ORAL
  Filled 2021-09-03 (×3): qty 1

## 2021-09-03 MED ORDER — STROKE: EARLY STAGES OF RECOVERY BOOK: Freq: Once | Status: DC

## 2021-09-03 MED ORDER — ACETAMINOPHEN 160 MG/5ML PO SOLN: 650.0000 mg | ORAL | Status: DC | PRN

## 2021-09-03 MED ORDER — ATORVASTATIN CALCIUM 40 MG PO TABS
40.0000 mg | ORAL_TABLET | Freq: Every day | ORAL | Status: DC
  Administered 2021-09-03 – 2021-09-06 (×4): 40 mg via ORAL
  Filled 2021-09-03 (×4): qty 1

## 2021-09-03 MED ORDER — INSULIN ASPART 100 UNIT/ML IJ SOLN: 0.0000 [IU] | Freq: Every day | INTRAMUSCULAR | Status: DC

## 2021-09-03 MED ORDER — PRAVASTATIN SODIUM 40 MG PO TABS: 20.0000 mg | ORAL_TABLET | Freq: Every day | ORAL | Status: DC

## 2021-09-03 MED ORDER — INSULIN ASPART 100 UNIT/ML IJ SOLN: 0.0000 [IU] | Freq: Three times a day (TID) | INTRAMUSCULAR | Status: DC

## 2021-09-03 MED ORDER — ACETAMINOPHEN 325 MG PO TABS: 650.0000 mg | ORAL_TABLET | ORAL | Status: DC | PRN

## 2021-09-03 MED ORDER — ACETAMINOPHEN 650 MG RE SUPP: 650.0000 mg | RECTAL | Status: DC | PRN

## 2021-09-03 NOTE — Plan of Care (Signed)
Discussed with pt during admission assessment: sxs of stroke, when to call 911, modifiable changes he can do to reduce chance of a stroke (weight, exercise, and diet). Pt doesn't smoke, drink, isn't a Diabetic, or use alcohol. Pt doesn't have HTN but does have extensive Cardiac Hx including an ICD. Pt voices understanding.

## 2021-09-03 NOTE — Evaluation (Signed)
Physical Therapy Evaluation and Discharge Patient Details Name: Bobby Griffin MRN: [id number] DOB: Sep 01, 1956 Today's Date: 09/03/2021  History of Present Illness  Pt is 65 yo male who presents with numbness and weakness of L hand while driving to [place] from [place] where he lives. PMH: ICD, coagulopathy (DVT's and PEs).  Clinical Impression  Patient evaluated by Physical Therapy with no further acute PT needs identified. All education has been completed and the patient has no further questions. Pt's symptoms have nearly resolved, continues to have mild paresthesis L hand. Pt ambulated independently in hallway as well as ascending flight of stairs safely with use of rail. Able to change gait speed and direction without LOB. Denied dizziness and headache. Reviewed stroke sxs.  See below for any follow-up Physical Therapy or equipment needs. PT is signing off. Thank you for this referral.        Recommendations for follow up therapy are one component of a multi-disciplinary discharge planning process, led by the attending physician.  Recommendations may be updated based on patient status, additional functional criteria and insurance authorization.  Follow Up Recommendations No PT follow up    Equipment Recommendations  None recommended by PT    Recommendations for Other Services       Precautions / Restrictions Precautions Precautions: None Restrictions Weight Bearing Restrictions: No      Mobility  Bed Mobility Overal bed mobility: Modified Independent                  Transfers Overall transfer level: Modified independent Equipment used: None             General transfer comment: stands quickly without difficulty or deficit  Ambulation/Gait Ambulation/Gait assistance: Modified independent (Device/Increase time) Gait Distance (Feet): 500 Feet Assistive device: None Gait Pattern/deviations: WFL(Within Functional Limits) Gait velocity: WFL Gait velocity interpretation:  >4.37 ft/sec, indicative of normal walking speed General Gait Details: smooth, fluid ambulation, no LOB  Stairs Stairs: Yes Stairs assistance: Modified independent (Device/Increase time) Stair Management: One rail Right;Alternating pattern;Forwards Number of Stairs: 10 General stair comments: independent on stairs, relayed that he feels mildly weak/ fatigued. HR peak 104 bpm  Wheelchair Mobility    Modified Rankin (Stroke Patients Only) Modified Rankin (Stroke Patients Only) Pre-Morbid Rankin Score: No symptoms Modified Rankin: No significant disability     Balance Overall balance assessment: Modified Independent                               Standardized Balance Assessment Standardized Balance Assessment : Berg Balance Test Berg Balance Test Sit to Stand: Able to stand without using hands and stabilize independently Standing Unsupported: Able to stand safely 2 minutes Sitting with Back Unsupported but Feet Supported on Floor or Stool: Able to sit safely and securely 2 minutes Stand to Sit: Sits safely with minimal use of hands Transfers: Able to transfer safely, minor use of hands Standing Unsupported with Eyes Closed: Able to stand 10 seconds safely Standing Ubsupported with Feet Together: Able to place feet together independently and stand 1 minute safely From Standing, Reach Forward with Outstretched Arm: Can reach confidently >25 cm (10") From Standing Position, Pick up Object from Floor: Able to pick up shoe safely and easily From Standing Position, Turn to Look Behind Over each Shoulder: Looks behind from both sides and weight shifts well Turn 360 Degrees: Able to turn 360 degrees safely in 4 seconds or less Standing Unsupported, Alternately Place Feet  on Step/Stool: Able to stand independently and safely and complete 8 steps in 20 seconds Standing Unsupported, One Foot in Front: Able to plae foot ahead of the other independently and hold 30 seconds Standing  on One Leg: Tries to lift leg/unable to hold 3 seconds but remains standing independently Total Score: 52         Pertinent Vitals/Pain Pain Assessment: No/denies pain    Home Living Family/patient expects to be discharged to:: Private residence Living Arrangements: Alone Available Help at Discharge: Friend(s);Available PRN/intermittently Type of Home: House Home Access: Stairs to enter Entrance Stairs-Rails: Left Entrance Stairs-Number of Steps: 3 Home Layout: Multi-level Home Equipment: None Additional Comments: was coming [place] to [place] to visit sisters. Works from home for [employer], desk job, infrequent travel    Prior Function Level of Independence: Independent         Comments: drives, works, Psychiatric nurse   Dominant Hand: Right (ambidextrous)    Extremity/Trunk Assessment   Upper Extremity Assessment Upper Extremity Assessment: Defer to OT evaluation    Lower Extremity Assessment Lower Extremity Assessment: Overall WFL for tasks assessed;RLE deficits/detail RLE Deficits / Details: tests WFL, reports a feeling of generalized weakness when ambulating and performing stairs but no deficits noted    Cervical / Trunk Assessment Cervical / Trunk Assessment: Other exceptions Cervical / Trunk Exceptions: cervical OA  Communication   Communication: No difficulties  Cognition Arousal/Alertness: Awake/alert Behavior During Therapy: WFL for tasks assessed/performed Overall Cognitive Status: Within Functional Limits for tasks assessed                                        General Comments General comments (skin integrity, edema, etc.): has some mild paresthesis L hand, much improved from yesterday    Exercises     Assessment/Plan    PT Assessment Patent does not need any further PT services  PT Problem List         PT Treatment Interventions      PT Goals (Current goals can be found in the Care Plan section)  Acute Rehab PT  Goals Patient Stated Goal: return home PT Goal Formulation: All assessment and education complete, DC therapy    Frequency     Barriers to discharge        Co-evaluation               AM-PAC PT "6 Clicks" Mobility  Outcome Measure Help needed turning from your back to your side while in a flat bed without using bedrails?: None Help needed moving from lying on your back to sitting on the side of a flat bed without using bedrails?: None Help needed moving to and from a bed to a chair (including a wheelchair)?: None Help needed standing up from a chair using your arms (e.g., wheelchair or bedside chair)?: None Help needed to walk in hospital room?: None Help needed climbing 3-5 steps with a railing? : None 6 Click Score: 24    End of Session Equipment Utilized During Treatment: Gait belt Activity Tolerance: Patient tolerated treatment well Patient left: with call bell/phone within reach (standing with OT) Nurse Communication: Mobility status PT Visit Diagnosis: Unsteadiness on feet (R26.81)    Time: 9233-0076 PT Time Calculation (min) (ACUTE ONLY): 33 min   Charges:   PT Evaluation $PT Eval Moderate Complexity: 1 Mod PT Treatments $Gait Training: 8-22  mins        Lyanne Co, PT  Acute Rehab Services  Pager [phone] Office [phone]   Lawana Chambers Jarrod Mcenery 09/03/2021, 10:57 AM

## 2021-09-03 NOTE — Progress Notes (Signed)
PROGRESS NOTE  Bobby Griffin    DOB: [birth year], 65 y.o.  VEL:[id number]  PCP: Pcp, No   Code Status: Full Code   DOA: 09/02/2021   LOS: 0  Brief Narrative of Current Hospitalization  Bobby Griffin is a 65 y.o. male with a PMH significant for nonischemic cardiomyopathy status post ICD placement in 2017, PAF, DVTs, HTN, HLD, noninsulin-dependent T2 DM, BPH. They presented from home to the ED on 09/02/2021 with facial droop and abnormal hand sensation/strength on left starting acutely today. In the ED, it was found that they had TIA. They were treated with anticoagulation, permissive hypertension, PT/OT, neurology consult.  Patient was admitted to medicine service for further workup and management of TIA as outlined in detail below.  09/03/21 -stable/improved  Assessment & Plan  Active Problems:   TIA (transient ischemic attack)  TIA-continues to have left-sided face sensation abnormality - neurology following, appreciate recs  - brain MRI/MRA, echo pending -PT/OT recommended no follow-up  HFrEF-last echo 04/2019 showed ejection fraction 30 to 35%.  ICD implantation in 2017. Home meds include Entresto, carvedilol, eplerenone -Holding home meds for permissive hypertension  PAF-stable and in sinus rhythm, rate controlled -Continue Xarelto  HTN-stable on admission.  Home meds include Entresto, carvedilol, eplerenone -Continue holding home meds  Type 2 diabetes  HLD-Home meds include Jardiance.  A1c 5.2 on admission, lipid panel showing LDL 92, total cholesterol 258, HDL 42 -Monitor blood sugars on daily labs only -Discontinue sliding scale insulin -Initiate atorvastatin 40 mg daily with LDL goal less than 70  DVT prophylaxis:   Xarelto   Diet:  Diet Orders (From admission, onward)     Start     Ordered   09/03/21 0611  Diet Heart Room service appropriate? Yes; Fluid consistency: Thin  Diet effective now       Question Answer Comment  Room service appropriate? Yes   Fluid  consistency: Thin      09/03/21 0610            Subjective 09/03/21    Pt reports continued abnormal sensation on left side of face and left hand weakness which have both improved since presentation.  Disposition Plan & Communication  Status is: Observation  The patient remains OBS appropriate and will d/c before 2 midnights.    Observation home  Family Communication: None Consults, Procedures, Significant Events  Consultants:  Neurology  Procedures/significant events:  Echo  Objective   Vitals:   09/02/21 2345 09/03/21 0015 09/03/21 0400 09/03/21 0557  BP: 107/87 116/88 103/63 98/71  Pulse: 69 68 77 68  Resp: 17 17 16 19   Temp:   98.5 F (36.9 C)   TempSrc:   Oral   SpO2: 98% 98% 96% 96%  Weight:   112.5 kg   Height:   6\' 1"  (1.854 m)    No intake or output data in the 24 hours ending 09/03/21 0729 Filed Weights   09/02/21 2128 09/03/21 0400  Weight: 120 kg 112.5 kg    Patient BMI: Body mass index is 32.72 kg/m.   Physical Exam: General: awake, alert, NAD Respiratory:normal respiratory effort. Cardiovascular: normal S1/S2,  RRR, no JVD, murmurs, rubs, gallops, quick capillary refill  Nervous: A&O x3. no gross focal neurologic deficits, normal speech Extremities: moves all equally, no edema, normal tone Skin: dry, intact, normal temperature, normal color, No rashes, lesions or ulcers Psychiatry: normal mood, congruent affect  Labs   I have personally reviewed following labs and imaging studies Admission on 09/02/2021  Component  Date Value Ref Range Status   Glucose-Capillary 09/02/2021 109 (A)  70 - 99 mg/dL Final   SARS Coronavirus 2 by RT PCR 09/02/2021 NEGATIVE  NEGATIVE Final   Influenza A by PCR 09/02/2021 NEGATIVE  NEGATIVE Final   Influenza B by PCR 09/02/2021 NEGATIVE  NEGATIVE Final   Sodium 09/02/2021 140  135 - 145 mmol/L Final   Potassium 09/02/2021 4.1  3.5 - 5.1 mmol/L Final   Chloride 09/02/2021 105  98 - 111 mmol/L Final   BUN  09/02/2021 24 (A)  8 - 23 mg/dL Final   Creatinine, Ser 09/02/2021 1.10  0.61 - 1.24 mg/dL Final   Glucose, Bld 58/52/7782 94  70 - 99 mg/dL Final   Calcium, Ion 42/35/3614 1.24  1.15 - 1.40 mmol/L Final   TCO2 09/02/2021 26  22 - 32 mmol/L Final   Hemoglobin 09/02/2021 16.7  13.0 - 17.0 g/dL Final   HCT 43/15/4008 49.0  39.0 - 52.0 % Final   Alcohol, Ethyl (B) 09/02/2021 <10  <10 mg/dL Final   Prothrombin Time 09/02/2021 16.2 (A)  11.4 - 15.2 seconds Final   INR 09/02/2021 1.3 (A)  0.8 - 1.2 Final   aPTT 09/02/2021 28  24 - 36 seconds Final   WBC 09/02/2021 5.3  4.0 - 10.5 K/uL Final   RBC 09/02/2021 5.42  4.22 - 5.81 MIL/uL Final   Hemoglobin 09/02/2021 16.3  13.0 - 17.0 g/dL Final   HCT 67/61/9509 49.6  39.0 - 52.0 % Final   MCV 09/02/2021 91.5  80.0 - 100.0 fL Final   MCH 09/02/2021 30.1  26.0 - 34.0 pg Final   MCHC 09/02/2021 32.9  30.0 - 36.0 g/dL Final   RDW 32/67/1245 12.3  11.5 - 15.5 % Final   Platelets 09/02/2021 171  150 - 400 K/uL Final   nRBC 09/02/2021 0.0  0.0 - 0.2 % Final   Neutrophils Relative % 09/02/2021 54  % Final   Neutro Abs 09/02/2021 2.9  1.7 - 7.7 K/uL Final   Lymphocytes Relative 09/02/2021 33  % Final   Lymphs Abs 09/02/2021 1.8  0.7 - 4.0 K/uL Final   Monocytes Relative 09/02/2021 11  % Final   Monocytes Absolute 09/02/2021 0.6  0.1 - 1.0 K/uL Final   Eosinophils Relative 09/02/2021 1  % Final   Eosinophils Absolute 09/02/2021 0.1  0.0 - 0.5 K/uL Final   Basophils Relative 09/02/2021 1  % Final   Basophils Absolute 09/02/2021 0.0  0.0 - 0.1 K/uL Final   Immature Granulocytes 09/02/2021 0  % Final   Abs Immature Granulocytes 09/02/2021 0.01  0.00 - 0.07 K/uL Final   Sodium 09/02/2021 137  135 - 145 mmol/L Final   Potassium 09/02/2021 4.2  3.5 - 5.1 mmol/L Final   Chloride 09/02/2021 104  98 - 111 mmol/L Final   CO2 09/02/2021 25  22 - 32 mmol/L Final   Glucose, Bld 09/02/2021 107 (A)  70 - 99 mg/dL Final   BUN 80/99/8338 22  8 - 23 mg/dL Final    Creatinine, Ser 09/02/2021 1.29 (A)  0.61 - 1.24 mg/dL Final   Calcium 25/03/3975 9.9  8.9 - 10.3 mg/dL Final   Total Protein 73/41/9379 6.9  6.5 - 8.1 g/dL Final   Albumin 02/40/9735 3.7  3.5 - 5.0 g/dL Final   AST 32/99/2426 25  15 - 41 U/L Final   ALT 09/02/2021 30  0 - 44 U/L Final   Alkaline Phosphatase 09/02/2021 73  38 - 126 U/L  Final   Total Bilirubin 09/02/2021 1.3 (A)  0.3 - 1.2 mg/dL Final   GFR, Estimated 09/02/2021 >60  >60 mL/min Final   Anion gap 09/02/2021 8  5 - 15 Final   Opiates 09/02/2021 NONE DETECTED  NONE DETECTED Final   Cocaine 09/02/2021 NONE DETECTED  NONE DETECTED Final   Benzodiazepines 09/02/2021 NONE DETECTED  NONE DETECTED Final   Amphetamines 09/02/2021 NONE DETECTED  NONE DETECTED Final   Tetrahydrocannabinol 09/02/2021 NONE DETECTED  NONE DETECTED Final   Barbiturates 09/02/2021 NONE DETECTED  NONE DETECTED Final   Color, Urine 09/02/2021 YELLOW  YELLOW Final   APPearance 09/02/2021 CLEAR  CLEAR Final   Specific Gravity, Urine 09/02/2021 1.028  1.005 - 1.030 Final   pH 09/02/2021 5.0  5.0 - 8.0 Final   Glucose, UA 09/02/2021 >=500 (A)  NEGATIVE mg/dL Final   Hgb urine dipstick 09/02/2021 NEGATIVE  NEGATIVE Final   Bilirubin Urine 09/02/2021 NEGATIVE  NEGATIVE Final   Ketones, ur 09/02/2021 5 (A)  NEGATIVE mg/dL Final   Protein, ur 11/21/3233 NEGATIVE  NEGATIVE mg/dL Final   Nitrite 57/32/2025 NEGATIVE  NEGATIVE Final   Leukocytes,Ua 09/02/2021 NEGATIVE  NEGATIVE Final   RBC / HPF 09/02/2021 0-5  0 - 5 RBC/hpf Final   WBC, UA 09/02/2021 0-5  0 - 5 WBC/hpf Final   Bacteria, UA 09/02/2021 NONE SEEN  NONE SEEN Final   HIV Screen 4th Generation wRfx 09/03/2021 Non Reactive  Non Reactive Final   Hgb A1c MFr Bld 09/03/2021 5.2  4.8 - 5.6 % Final   Mean Plasma Glucose 09/03/2021 102.54  mg/dL Final   Cholesterol 42/70/6237 142  0 - 200 mg/dL Final   Triglycerides 62/83/1517 40  <150 mg/dL Final   HDL 61/60/7371 42  >40 mg/dL Final   Total CHOL/HDL Ratio  09/03/2021 3.4  RATIO Final   VLDL 09/03/2021 8  0 - 40 mg/dL Final   LDL Cholesterol 09/03/2021 92  0 - 99 mg/dL Final   Troponin I (High Sensitivity) 09/03/2021 25 (A)  <18 ng/L Final   Glucose-Capillary 09/03/2021 133 (A)  70 - 99 mg/dL Final    Imaging Studies  CT HEAD WO CONTRAST  Result Date: 09/02/2021 CLINICAL DATA:  Neuro deficit, acute, stroke suspected Left-sided body weakness.  Confusion and dizziness. EXAM: CT HEAD WITHOUT CONTRAST TECHNIQUE: Contiguous axial images were obtained from the base of the skull through the vertex without intravenous contrast. COMPARISON:  None. FINDINGS: Brain: No intracranial hemorrhage, mass effect, or midline shift. No hydrocephalus. Incidental cavum septum pellucidum, normal variant. The basilar cisterns are patent. No evidence of territorial infarct or acute ischemia. No extra-axial or intracranial fluid collection. Vascular: No hyperdense vessel or unexpected calcification. Skull: Normal. Negative for fracture or focal lesion. Sinuses/Orbits: Paranasal sinuses and mastoid air cells are clear. The visualized orbits are unremarkable. Other: None. IMPRESSION: Negative noncontrast head CT. Electronically Signed   By: Narda Rutherford M.D.   On: 09/02/2021 22:26   Medications   Scheduled Meds:   stroke: mapping our early stages of recovery book   Does not apply Once   insulin aspart  0-5 Units Subcutaneous QHS   insulin aspart  0-9 Units Subcutaneous TID WC   pravastatin  20 mg Oral QHS   tamsulosin  0.4 mg Oral QPM   No recently discontinued medications to reconcile  LOS: 0 days   Time spent: >85min  Leeroy Bock, DO [facility] 09/03/2021, 7:29 AM   To contact the [facility] Attending or Consulting provider  for this patient: Check the care team in [facility] for a) attending/consulting [facility] provider listed and b) the [facility] team listed Log into [email or web address] and use [place]'s universal password to access. If you do not have the password,  please contact the hospital operator. Locate the [facility] provider you are looking for under [facility] and page to a number that you can be directly reached. If you still have difficulty reaching the provider, please page the [facility] (Director on Call) for the Hospitalists listed on amion for assistance.

## 2021-09-03 NOTE — Progress Notes (Signed)
Pt arrived per stretcher in stable condition. Assessment started and pt placed on heart monitor.

## 2021-09-03 NOTE — Consult Note (Signed)
Neurology Consult H&P  Lorain Keast MR# [id number] 09/03/2021   CC: TIA  History is obtained from: Patient and chart.  HPI: Anais Koenen is a 65 y.o. male ambidextrous (left) [place] [employer] veteran PMHx as reviewed below, low ejection fraction (thought to be due to virus), ICD implantation, history of coagulopathy (LLE DVT and PEs on rivaroxaban 20 mg daily) was driving to [place] from [place] where he lives and developed sense of depersonalization followed by numbness and weakness in his left hand around 1730 on 09/02/2021.  He was in a store and felt clumsiness of his left hand as if it was not truly what he wanted to do and he proceeded directly to ED for further evaluation.  He is never had symptoms before  He was previously on LMWH then warfarin and transition to rivaroxaban which she has been on for years.  He does state that he donated blood earlier this week and his cardiologist told him to hold rivaroxaban Monday and Tuesday.  After donating blood he immediately restarted rivaroxaban.   LKW: 1730 tNK given: No outside window IR Thrombectomy No Modified Rankin Scale: 0-Completely asymptomatic and back to baseline post- stroke NIHSS: 2 LOC Responsiveness 0 LOC Questions 0 LOC Commands 0 Horizontal eye movement 0 Visual field 0 Facial palsy 1 Motor arm - Right arm 0 Motor arm - Left arm 0 Motor leg - Right leg 0 Motor leg - Left leg 0 Limb ataxia 0 Sensory test 1 Language 0 Speech 0 Extinction and inattention 0  Denies: N/V/SOB/CP/changes in vision and hearing.  ROS: A complete ROS was performed and is negative except as noted in the HPI.   Past Medical History:  Diagnosis Date   ICD (implantable cardioverter-defibrillator) in place    One of his sisters has cancer  Social History:  reports that he has never smoked. He has never used smokeless tobacco. He reports that he does not drink alcohol and does not use drugs.   Prior to Admission medications    Medication Sig Start Date End Date Taking? Authorizing Provider  carvedilol (COREG) 25 MG tablet Take 12.5 mg by mouth in the morning and at bedtime. 01/14/20 09/28/21 Yes [provider]  diclofenac Sodium (VOLTAREN) 1 % GEL Apply 2-4 g topically every 4 (four) hours as needed (pain). 06/30/21 06/30/22 Yes [provider]  empagliflozin (JARDIANCE) 10 MG TABS tablet Take 10 mg by mouth in the morning. 06/30/21 06/29/22 Yes [provider]  eplerenone (INSPRA) 25 MG tablet Take 12.5 mg by mouth daily. 01/14/20 06/29/22 Yes [provider]  glucosamine-chondroitin 500-400 MG tablet Take 2 tablets by mouth every evening.   Yes [provider]  Multiple Vitamins-Minerals (MULTIVITAMIN WITH MINERALS) tablet Take 1 tablet by mouth daily.   Yes [provider]  pravastatin (PRAVACHOL) 20 MG tablet Take 20 mg by mouth at bedtime. 06/30/21 06/29/22 Yes [provider]  rivaroxaban (XARELTO) 20 MG TABS tablet Take 20 mg by mouth every evening. 01/14/20 06/29/22 Yes [provider]  sacubitril-valsartan (ENTRESTO) 24-26 MG Take 1 tablet by mouth 2 (two) times daily. 01/14/20 06/29/22 Yes [provider]  Saline (OCEAN NASAL SPRAY NA) Place 1 spray into both nostrils daily as needed (congestion).   Yes [provider]  sildenafil (VIAGRA) 25 MG tablet Take 25 mg by mouth daily as needed for erectile dysfunction. 07/05/21 06/30/22 Yes [provider]  tamsulosin (FLOMAX) 0.4 MG CAPS capsule Take 1 capsule by mouth every evening. 01/14/20 10/11/21 Yes [provider]    Exam: Current vital signs: BP 116/88   Pulse 68   Temp 98.7 F (37.1 C) (Oral)   Resp 17   Ht 6\' 1"  (1.854 m)   Wt 120 kg   SpO2 98%   BMI 34.90 kg/m   Physical Exam  Constitutional: Appears well-developed and well-nourished.  Psych: Affect appropriate to situation Eyes: No scleral injection HENT: No OP obstruction. Head: Normocephalic.   Cardiovascular: Normal rate and regular rhythm.  Respiratory: Effort normal, symmetric excursions bilaterally, no audible wheezing. GI: Soft.  No distension. There is no tenderness.  Skin: WDI  Neuro: Mental Status: Patient is awake, alert, oriented to person, place, month, year, and situation. Patient is able to give a clear and coherent history. Speech fluent, intact comprehension and repetition. No signs of aphasia or neglect. Visual Fields are full. Pupils are equal, round, and reactive to light. EOMI without ptosis or diploplia.  Facial sensation is decreased to light touch left Facial left lower face weakness.  Hearing is intact to voice. Uvula midline and palate elevates symmetrically. Shoulder shrug is symmetric. Tongue is midline without atrophy or fasciculations.  Tone is normal. Bulk is normal. 4+/5 left hand otherwise good strength in all four extremities. Sensation is decreased to light touch left upper extremity. Deep Tendon Reflexes: 2+ and symmetric in the biceps and patellae. Toes are downgoing bilaterally. FNF and HKS are intact bilaterally. Gait - Deferred  I have reviewed labs in epic and the pertinent results are: A1c 5.2  I have reviewed the images obtained: NCT head showed no acute ischemic changes, hemorrhage, mass.  Assessment: Deiontae Rabel is a 65 y.o. male ambidextrous (left) 70 [employer] veteran PMHx as as above, low ejection fraction (thought to be due to virus), s/p ablation and ICD implantation, history of coagulopathy (LLE DVT and PEs on rivaroxaban 20 mg daily).  He said his most recent echocardiogram was about a year ago and the ejection fraction was 30 to 35% without mention of LVT or PFO.  He has close follow-up with his cardiologist.  It is likely that she has had an mild stroke and will need further evaluation.  ICD is MRI safe and he has the information on his phone.   Plan: - MRI brain without contrast. - Recommend vascular imaging with MRA  head and neck. - Recommend TTE. - Recommend labs: lipid panel. - Recommend Statin if LDL > 70 -Continue rivaroxaban 20 mg daily until further labs and echo results. - SBP goal <180. - Telemetry monitoring for arrhythmia. - Recommend bedside Swallow screen. - Recommend Stroke education. - Recommend PT/OT/SLP consult.   Electronically signed by:  [place], MD Page: Marisue Humble 09/03/2021, 3:09 AM

## 2021-09-03 NOTE — Evaluation (Signed)
Occupational Therapy Evaluation Patient Details Name: Bobby Griffin MRN: [id number] DOB: 08/03/1956 Today's Date: 09/03/2021   History of Present Illness Pt is 65 yo male who presents with numbness and weakness of L hand while driving to [place] from [place] where he lives. PMH: ICD, coagulopathy (DVT's and PEs).   Clinical Impression   Pt admitted for concerns listed above. PTA pt reported that he was independent with all ADL's and IADL's, including working, driving, and assisting with taking care of his sister with Downs Syndrome. At this time pt continues to demosntrate independence with all ADL's, functional mobility, and fine motor tasks. He reports that he still feels wobbly despite a strong and fast gait, as well as some awkwardness/incoordination with LUE. At this time, he has no further OT needs and acute OT will sign off.       Recommendations for follow up therapy are one component of a multi-disciplinary discharge planning process, led by the attending physician.  Recommendations may be updated based on patient status, additional functional criteria and insurance authorization.   Follow Up Recommendations  No OT follow up    Equipment Recommendations  None recommended by OT    Recommendations for Other Services       Precautions / Restrictions Precautions Precautions: None Restrictions Weight Bearing Restrictions: No      Mobility Bed Mobility Overal bed mobility: Modified Independent                  Transfers Overall transfer level: Modified independent Equipment used: None             General transfer comment: stands quickly without difficulty or deficit    Balance Overall balance assessment: Modified Independent                               Standardized Balance Assessment Standardized Balance Assessment : Berg Balance Test Berg Balance Test Sit to Stand: Able to stand without using hands and stabilize independently Standing  Unsupported: Able to stand safely 2 minutes Sitting with Back Unsupported but Feet Supported on Floor or Stool: Able to sit safely and securely 2 minutes Stand to Sit: Sits safely with minimal use of hands Transfers: Able to transfer safely, minor use of hands Standing Unsupported with Eyes Closed: Able to stand 10 seconds safely Standing Ubsupported with Feet Together: Able to place feet together independently and stand 1 minute safely From Standing, Reach Forward with Outstretched Arm: Can reach confidently >25 cm (10") From Standing Position, Pick up Object from Floor: Able to pick up shoe safely and easily From Standing Position, Turn to Look Behind Over each Shoulder: Looks behind from both sides and weight shifts well Turn 360 Degrees: Able to turn 360 degrees safely in 4 seconds or less Standing Unsupported, Alternately Place Feet on Step/Stool: Able to stand independently and safely and complete 8 steps in 20 seconds Standing Unsupported, One Foot in Front: Able to plae foot ahead of the other independently and hold 30 seconds Standing on One Leg: Tries to lift leg/unable to hold 3 seconds but remains standing independently Total Score: 52       ADL either performed or assessed with clinical judgement   ADL Overall ADL's : At baseline;Independent  General ADL Comments: No difficulties or safety concerns with ADL's.     Vision Baseline Vision/History: 0 No visual deficits Ability to See in Adequate Light: 0 Adequate Patient Visual Report: No change from baseline Vision Assessment?: No apparent visual deficits     Perception Perception Perception Tested?: No   Praxis Praxis Praxis tested?: Not tested    Pertinent Vitals/Pain Pain Assessment: No/denies pain     Hand Dominance Right (ambidextrous)   Extremity/Trunk Assessment Upper Extremity Assessment Upper Extremity Assessment: Overall WFL for tasks assessed    Lower Extremity Assessment Lower Extremity Assessment: Overall WFL for tasks assessed RLE Deficits / Details: tests WFL, reports a feeling of generalized weakness when ambulating and performing stairs but no deficits noted   Cervical / Trunk Assessment Cervical / Trunk Assessment: Other exceptions Cervical / Trunk Exceptions: cervical OA   Communication Communication Communication: No difficulties   Cognition Arousal/Alertness: Awake/alert Behavior During Therapy: WFL for tasks assessed/performed Overall Cognitive Status: Within Functional Limits for tasks assessed                                     General Comments  Pt able to complete writing, clock drawing, and shoe tying with no difficulties, continues to report mild numbness/lack of coordination in L hand    Exercises     Shoulder Instructions      Home Living Family/patient expects to be discharged to:: Private residence Living Arrangements: Alone Available Help at Discharge: Friend(s);Available PRN/intermittently Type of Home: House Home Access: Stairs to enter Entrance Stairs-Number of Steps: 3 Entrance Stairs-Rails: Left Home Layout: Multi-level Alternate Level Stairs-Number of Steps: flight   Bathroom Shower/Tub: Producer, television/film/video: Handicapped height     Home Equipment: None   Additional Comments: was coming [facility] [place] visit sisters. Works from home for [employer], desk job, infrequent travel      Prior Functioning/Environment Level of Independence: Independent        Comments: drives, works, Animator Problem List: Decreased strength;Decreased coordination      OT Treatment/Interventions:      OT Goals(Current goals can be found in the care plan section) Acute Rehab OT Goals Patient Stated Goal: return home OT Goal Formulation: All assessment and education complete, DC therapy Time For Goal Achievement: 09/03/21 Potential to Achieve Goals: Good  OT  Frequency:     Barriers to D/C:            Co-evaluation              AM-PAC OT "6 Clicks" Daily Activity     Outcome Measure Help from another person eating meals?: None Help from another person taking care of personal grooming?: None Help from another person toileting, which includes using toliet, bedpan, or urinal?: None Help from another person bathing (including washing, rinsing, drying)?: None Help from another person to put on and taking off regular upper body clothing?: None Help from another person to put on and taking off regular lower body clothing?: None 6 Click Score: 24   End of Session Nurse Communication: Mobility status  Activity Tolerance: Patient tolerated treatment well Patient left: in bed;with call bell/phone within reach  OT Visit Diagnosis: Muscle weakness (generalized) (M62.81)                Time: 1007-1040 OT Time Calculation (min): 33 min Charges:  OT General Charges $OT Visit: 1 Visit OT Evaluation $OT Eval Low Complexity: 1 Low OT Treatments $Therapeutic Activity: 8-22 mins  Ailyne Pawley H., OTR/L Acute Rehabilitation  Bobby Griffin 09/03/2021, 11:35 AM

## 2021-09-03 NOTE — Progress Notes (Addendum)
STROKE TEAM PROGRESS NOTE   INTERVAL HISTORY Patient is seen in room with nobody at the bedside.  He is hemodynamically stable and in no acute distress. He reports sudden onset of left upper extremity incoordination numbness weakness with some disorientation and states he is feeling much better this morning.  CT scan of the head is unremarkable and MRI is not yet done since he had an ICD in [place] need to be reprogrammed after the MRI.  Carotid ultrasound and transcranial Doppler studies are pending.  TCD bubble study performed personally at the bedside is negative for PFO.  Lower extremity venous Dopplers also negative for DVT urine drug screen is negative.  LDL cholesterol is 92 mg percent.  Hemoglobin A1c is 5.2. Vitals:   09/02/21 2345 09/03/21 0015 09/03/21 0400 09/03/21 0557  BP: 107/87 116/88 103/63 98/71  Pulse: 69 68 77 68  Resp: 17 17 16 19   Temp:   98.5 F (36.9 C)   TempSrc:   Oral   SpO2: 98% 98% 96% 96%  Weight:   112.5 kg   Height:   6\' 1"  (1.854 m)    CBC:  Recent Labs  Lab 09/02/21 2153 09/02/21 2212  WBC 5.3  --   NEUTROABS 2.9  --   HGB 16.3 16.7  HCT 49.6 49.0  MCV 91.5  --   PLT 171  --    Basic Metabolic Panel:  Recent Labs  Lab 09/02/21 2153 09/02/21 2212  NA 137 140  K 4.2 4.1  CL 104 105  CO2 25  --   GLUCOSE 107* 94  BUN 22 24*  CREATININE 1.29* 1.10  CALCIUM 9.9  --    Lipid Panel:  Recent Labs  Lab 09/03/21 0240  CHOL 142  TRIG 40  HDL 42  CHOLHDL 3.4  VLDL 8  LDLCALC 92   HgbA1c:  Recent Labs  Lab 09/03/21 0240  HGBA1C 5.2   Urine Drug Screen:  Recent Labs  Lab 09/02/21 2202  LABOPIA NONE DETECTED  COCAINSCRNUR NONE DETECTED  LABBENZ NONE DETECTED  AMPHETMU NONE DETECTED  THCU NONE DETECTED  LABBARB NONE DETECTED    Alcohol Level  Recent Labs  Lab 09/02/21 2153  ETH <10    IMAGING past 24 hours CT HEAD WO CONTRAST  Result Date: 09/02/2021 CLINICAL DATA:  Neuro deficit, acute, stroke suspected Left-sided body  weakness.  Confusion and dizziness. EXAM: CT HEAD WITHOUT CONTRAST TECHNIQUE: Contiguous axial images were obtained from the base of the skull through the vertex without intravenous contrast. COMPARISON:  None. FINDINGS: Brain: No intracranial hemorrhage, mass effect, or midline shift. No hydrocephalus. Incidental cavum septum pellucidum, normal variant. The basilar cisterns are patent. No evidence of territorial infarct or acute ischemia. No extra-axial or intracranial fluid collection. Vascular: No hyperdense vessel or unexpected calcification. Skull: Normal. Negative for fracture or focal lesion. Sinuses/Orbits: Paranasal sinuses and mastoid air cells are clear. The visualized orbits are unremarkable. Other: None. IMPRESSION: Negative noncontrast head CT. Electronically Signed   By: [id number] M.D.   On: 09/02/2021 22:26    PHYSICAL EXAM General: Patient is an alert, well-nourished male in no acute distress  . Afebrile. Head is nontraumatic. Neck is supple without bruit.    Cardiac exam no murmur or gallop. Lungs are clear to auscultation. Distal pulses are well felt.  Neurological Exam ;  Awake  Alert oriented x 3. Normal speech and language.eye movements full without nystagmus.fundi were not visualized. Vision acuity and fields appear normal. Hearing is  normal. Palatal movements are normal. Face symmetric. Tongue midline. Normal strength, tone, reflexes and coordination.  Diminished fine finger movements on the left.  Orbits right over left upper extremity.  Normal sensation. Gait deferred.  ASSESSMENT/PLAN [name] is an ambidextrous 65 y.o. male with history of nonischemic CHF with AICD, paroxysmal a-fib/a-flutter, DVT, PE on home Xarelto, HTN, HLD, T2DM and BPH presenting with acute onset weakness and sensory deficits of the left hand.  This occurred when patient was driving from his home in [place] to [place], and patient came to the hospital after onset of symptoms.  Of note, he  held two doses of his Xarelto earlier this week (after discussion with his cardiologist) so that he could donate blood.  Head CT on arrival showed no acute changes, and MRI is to be performed on Monday when his AICD can be deactivated for the study.  Further workup will include lower extremity venous dopplers, TTE and transcranial doppler with bubble study.  Stroke: Suspect right parietal  infarct not seen on CT scan CT head No acute abnormality.  MRI  pending 2D Echo pending Transcranial doppler with bubble study negative for right-to-left shunt LDL 92 HgbA1c 5.2 VTE prophylaxis - fully anticoagulated on Xarelto    Diet   Diet Heart Room service appropriate? Yes; Fluid consistency: Thin   Xarelto (rivaroxaban) daily prior to admission, now on Xarelto (rivaroxaban) daily.  Therapy recommendations:  pending Disposition:  to home  Hypertension Home meds:  carvedilol 12.5 mg daily Stable Permissive hypertension (OK if <180) but gradually normalize in 5-7 days Long-term BP goal normotensive  Hyperlipidemia Home meds:  pravastatin 20 mg daily, atorvastain 40 mg daily given in hospital LDL 92, goal < 70 High intensity statin continued Continue statin at discharge  Diabetes type II Controlled Home meds:  Jardiance 10 mg daily HgbA1c 5.2, goal < 7.0 CBGs Recent Labs    09/02/21 2130 09/03/21 0703  GLUCAP 109* 133*    SSI  Other Stroke Risk Factors Obesity, Body mass index is 32.72 kg/m., BMI >/= 30 associated with increased stroke risk, recommend weight loss, diet and exercise as appropriate  Congestive heart failure  Other Active Problems Paroxysmal atrial fibrillation/atrial flutter Continue anticoagulation with Xarelto Continue inpatient cardiac monitoring  History of DVT and PE Continue anticoagulation with Xarelto Lower extremity venous doppler study pending  BPH Continue home tamsulosin  Hospital day # 0  I have personally obtained history,examined this  patient, reviewed notes, independently viewed imaging studies, participated in medical decision making and plan of care.ROS completed by me personally and pertinent positives fully documented  I have made any additions or clarifications directly to the above note. Agree with note above.  Patient presented with sudden onset of left upper extremity incoordination, numbness weakness and some facial paresthesias likely due to right parietal cortical embolic infarct.  He is already on Xarelto daily as been taking it only with light snack.  He was counseled to take it consistently at the same time with a proper meal.  Continue ongoing stroke work-up.  Patient may have to stay the weekend to get his MRI on Monday since he has a ICD.  Aggressive risk factor modification.  Greater than 50% time during this 35-minute visit were spent in counseling and coordination of care and discussion with care team.  Discussed with Dr. Dareen Piano.  Delia Heady, MD Medical Director [facility] Pager: [phone] 09/03/2021 2:36 PM   To contact Stroke Continuity provider, please refer to [email or web address].  After hours, contact General Neurology

## 2021-09-03 NOTE — Progress Notes (Addendum)
Lower extremity venous & TCD bubble study has been completed.   Preliminary results in CV Proc.   Dorota Heinrichs Kayleigh Broadwell 09/03/2021 2:20 PM

## 2021-09-04 ENCOUNTER — Observation Stay (HOSPITAL_COMMUNITY)

## 2021-09-04 DIAGNOSIS — G459 Transient cerebral ischemic attack, unspecified: Secondary | ICD-10-CM | POA: Diagnosis not present

## 2021-09-04 LAB — TROPONIN I (HIGH SENSITIVITY): Troponin I (High Sensitivity): 22 ng/L — ABNORMAL HIGH (ref <18)

## 2021-09-04 LAB — MAGNESIUM: Magnesium: 1.8 mg/dL (ref 1.7–2.4)

## 2021-09-04 IMAGING — CT CT HEAD W/O CM
4 series · 16 of 47 positions shown, 18 images · non-contrast
Comparison: Head CT [DATE].

CLINICAL DATA: 64-year-old male with neurologic deficit. Left side
weakness.

EXAM:
CT HEAD WITHOUT CONTRAST
TECHNIQUE: Contiguous axial images were obtained from the base of the skull
through the vertex without intravenous contrast.

[Series 3: head wo · axial · 0.51mm/px · z∈[-143,-3]mm · 7 of 38 slices shown, 9 images]
[im 5/38  brain]
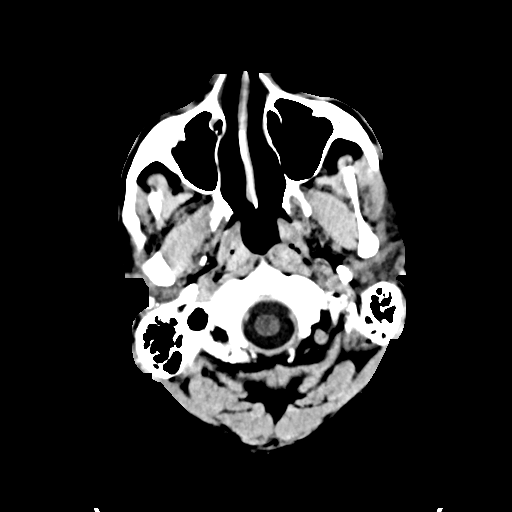
[im 5/38  bone]
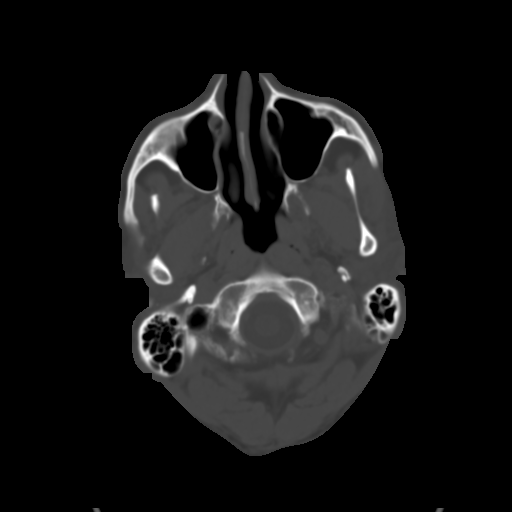
[im 10/38  brain]
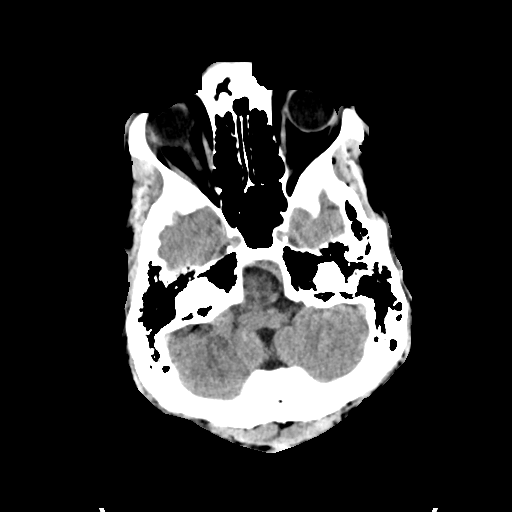
[im 14/38  brain]
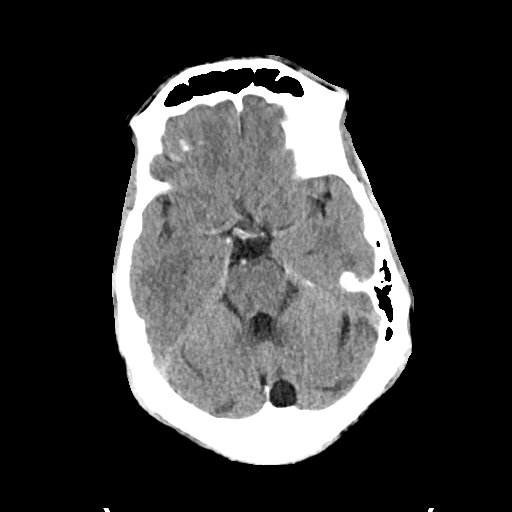
[im 19/38  brain]
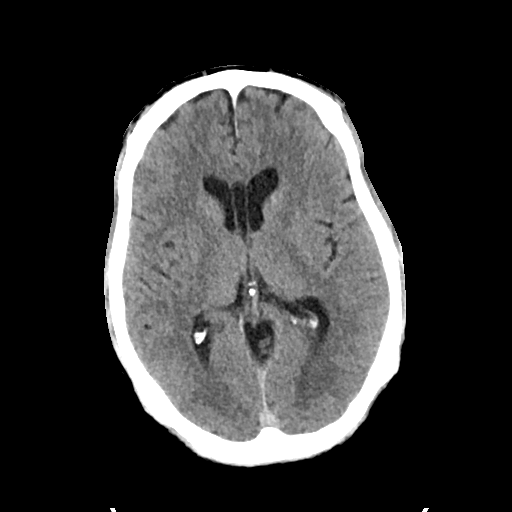
[im 24/38  brain]
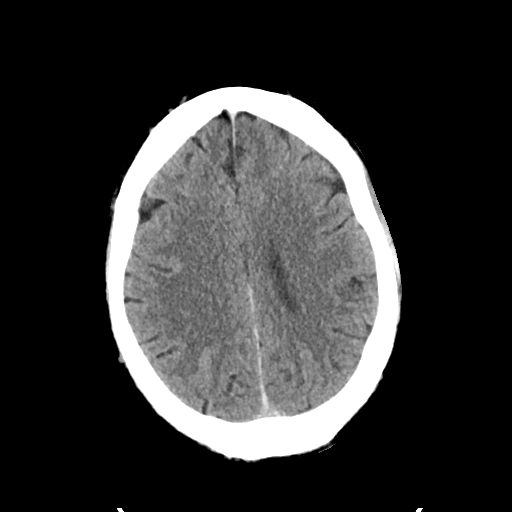
[im 24/38  bone]
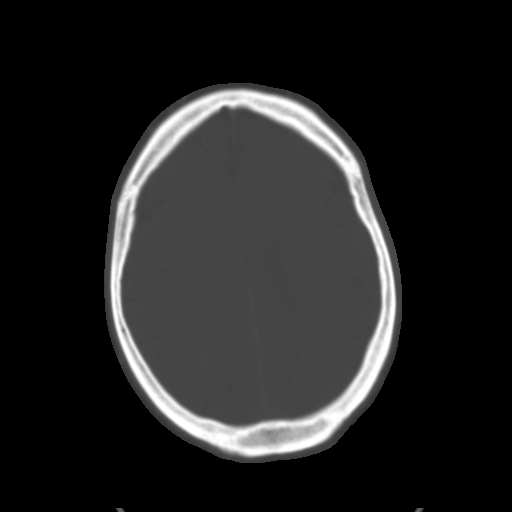
[im 28/38  brain]
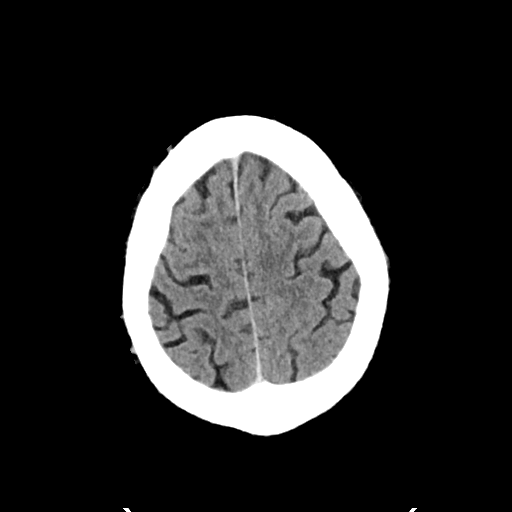
[im 33/38  brain]
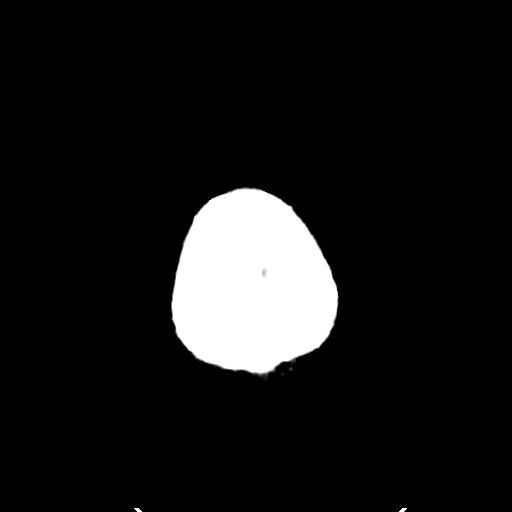

[Series 4: head bone · axial · 0.51mm/px · z∈[-145,-109]mm · 3 of 93 slices shown]
[im 10/93  bone]
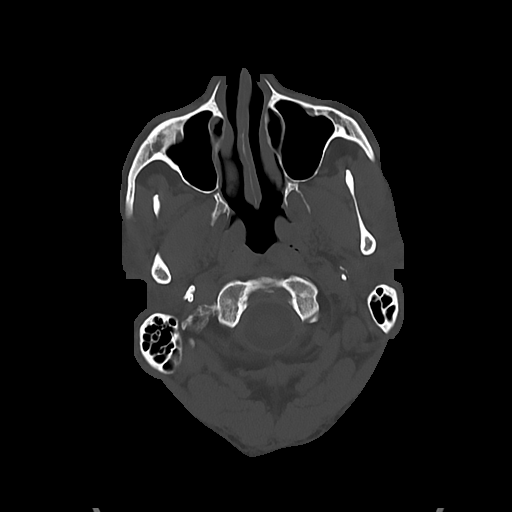
[im 19/93  bone]
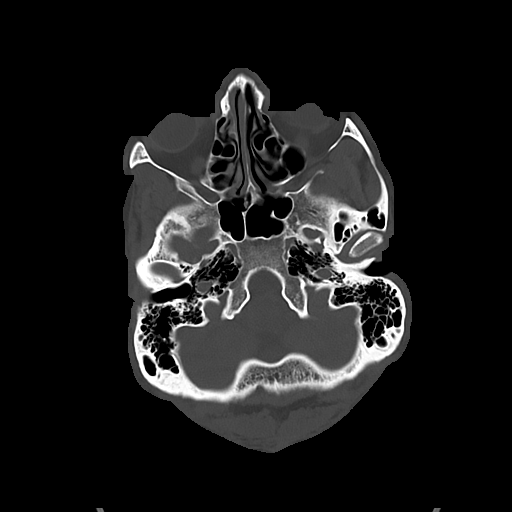
[im 28/93  bone]
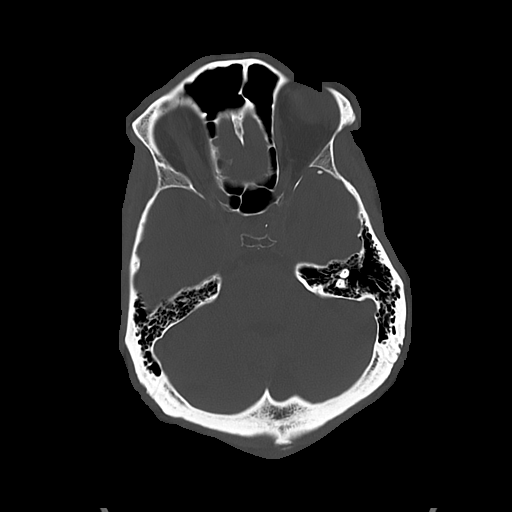

[Series 5: cor soft · coronal · 0.36mm/px · 3 of 78 slices shown]
[im 26/78  brain]
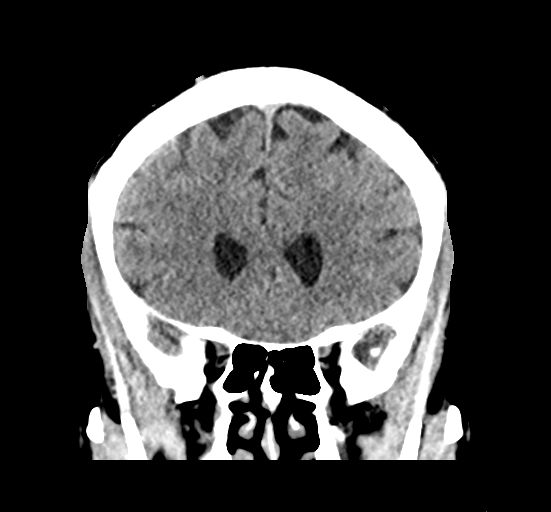
[im 35/78  brain]
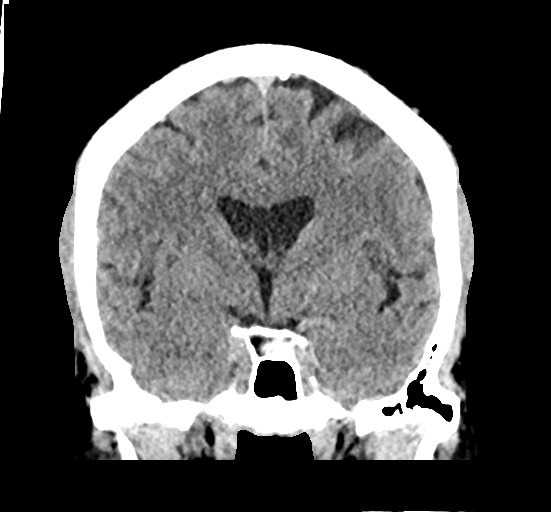
[im 43/78  brain]
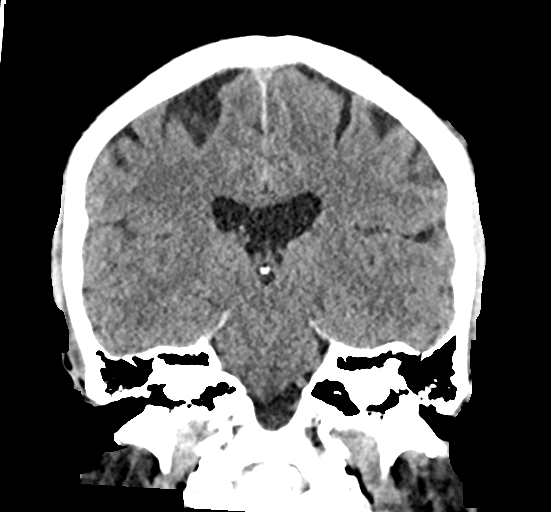

[Series 6: sag soft · sagittal · 0.36mm/px · 3 of 66 slices shown]
[im 22/66  brain]
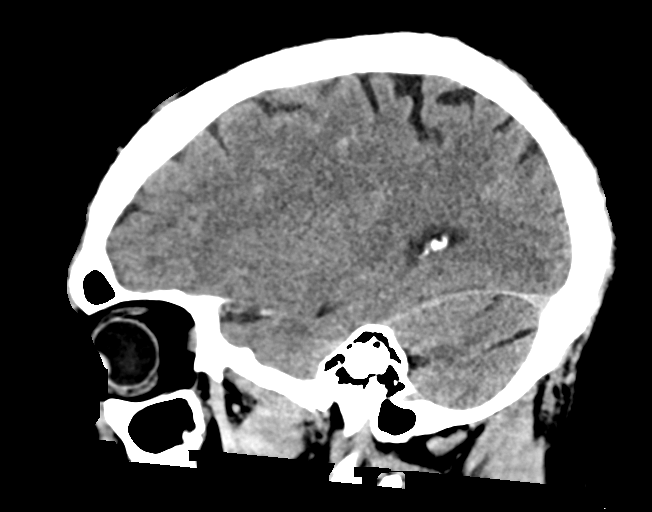
[im 33/66  brain]
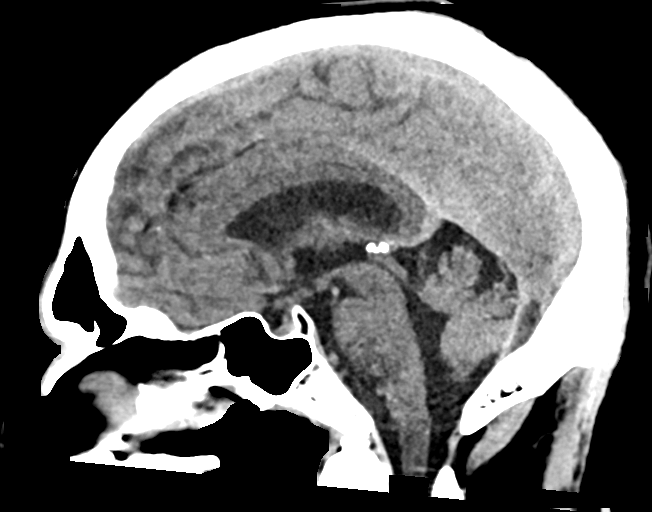
[im 44/66  brain]
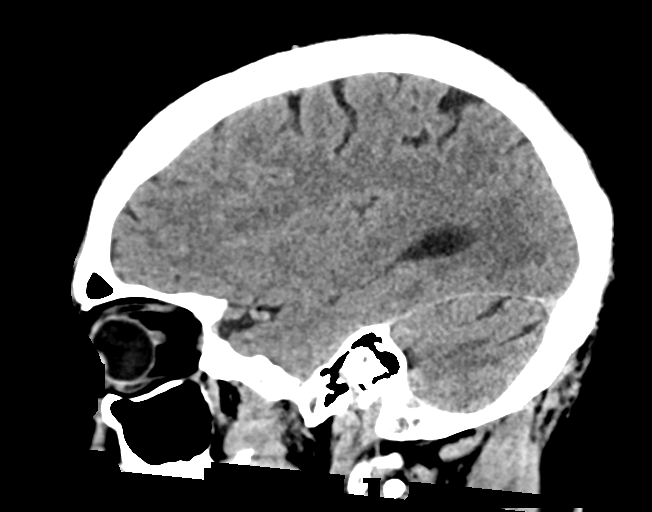

[16 of 47 positions shown; findings below may reference images not displayed]

FINDINGS: Brain: Cavum septum pellucidum, normal variant. Cerebral volume is
within normal limits for age. No midline shift, mass effect, or
evidence of intracranial mass lesion. No ventriculomegaly. No acute
intracranial hemorrhage identified.

Supratentorial gray-white matter differentiation appears stable and
within normal limits. But heterogeneous hypodensity in the pons is
difficult to exclude (series 3, image 12), although seems more
pronounced on the left (would cause right side symptoms). No acute
cortically based cerebral or cerebellar infarct identified.

Vascular: Calcified atherosclerosis at the skull base. No suspicious
intracranial vascular hyperdensity.

Skull: Stable, negative.

Sinuses/Orbits: Visualized paranasal sinuses and mastoids are stable
and well aerated.

Other: No acute orbit or scalp soft tissue finding.
IMPRESSION: 1. Difficult to exclude age indeterminate small vessel ischemia in
the pons, Brain MRI would best evaluate further if symptoms persist.

2. Otherwise negative for age noncontrast CT appearance of the
brain.

## 2021-09-04 MED ORDER — CALCIUM CARBONATE ANTACID 500 MG PO CHEW
1.0000 | CHEWABLE_TABLET | Freq: Two times a day (BID) | ORAL | Status: DC | PRN
  Administered 2021-09-04 – 2021-09-05 (×2): 200 mg via ORAL
  Filled 2021-09-04 (×2): qty 1

## 2021-09-04 MED ORDER — SODIUM CHLORIDE 0.9 % IV SOLN: INTRAVENOUS | Status: DC | PRN

## 2021-09-04 MED ORDER — MAGNESIUM SULFATE IN D5W 1-5 GM/100ML-% IV SOLN
1.0000 g | Freq: Once | INTRAVENOUS | Status: AC
  Administered 2021-09-04: 1 g via INTRAVENOUS
  Filled 2021-09-04: qty 100

## 2021-09-04 NOTE — Progress Notes (Signed)
PROGRESS NOTE    Bobby Griffin  SEG:[id number] DOB: 1956-10-13 DOA: 09/02/2021 PCP: Pcp, No     Brief Narrative:  Bobby Griffin is a 65 y.o. male with a PMH significant for nonischemic cardiomyopathy status post ICD placement in 2017, PAF, DVTs, HTN, HLD, noninsulin-dependent T2 DM, BPH. They presented from home to the ED on 09/02/2021 with facial droop and abnormal hand sensation/strength on left starting acutely today. In the ED, it was found that they had TIA. They were treated with anticoagulation, permissive hypertension, PT/OT, neurology consult.  Patient was admitted to medicine service for further workup and management of TIA as outlined in detail below.  New events last 24 hours / Subjective: Patient states that his neurological complaints have completely resolved.  Had some mild chest pain after eating breakfast this morning, passing a lot of gas as well.  Assessment & Plan:   Principal Problem:   TIA (transient ischemic attack) Active Problems:   Chronic systolic CHF (congestive heart failure) (HCC)   AF (paroxysmal atrial fibrillation) (HCC)   Essential hypertension   Hyperlipidemia   TIA -Appreciate neurology -MRI brain pending, hopefully will be done Monday.  Due to his pacemaker, awaiting for MRI technician to be available  -PT OT SLP recommended no further follow-up  Chest pain -Likely GI related -EKG reviewed independently without acute ST elevation -Check troponin today   HFrEF -Last echo 04/2019 showed ejection fraction 30 to 35%.  ICD implantation in 2017. Home meds include Entresto, carvedilol, eplerenone -Repeat echocardiogram showed EF 40 to 45%, grade 1 diastolic dysfunction -Follow-up with cardiology as outpatient   PAF -Continue Xarelto   HTN -Continue holding home meds to allow permissive hypertension   Type 2 diabetes -A1c 5.2.  Well controlled  Hyperlipidemia -Lipitor   DVT prophylaxis:   rivaroxaban (XARELTO) tablet 20 mg  Code  Status: Full  Family Communication: None at bedside Disposition Plan:  Status is: Observation  The patient remains OBS appropriate. Awaiting MRI.     Consultants:  Neurology  Procedures:  None   Antimicrobials:  Anti-infectives (From admission, onward)    None        Objective: Vitals:   09/04/21 0100 09/04/21 0452 09/04/21 0808 09/04/21 1100  BP: 98/67 99/70 104/69 108/79  Pulse: 64 71 65 63  Resp: 14 14 14 14   Temp: 97.9 F (36.6 C) 97.9 F (36.6 C) 98.2 F (36.8 C) 98.2 F (36.8 C)  TempSrc: Oral Oral Oral Oral  SpO2: 98% 96% 92% 99%  Weight:      Height:        Intake/Output Summary (Last 24 hours) at 09/04/2021 1343 Last data filed at 09/04/2021 0900 Gross per 24 hour  Intake 336 ml  Output --  Net 336 ml   Filed Weights   09/02/21 2128 09/03/21 0400  Weight: 120 kg 112.5 kg    Examination:  General exam: Appears calm and comfortable  Respiratory system: Clear to auscultation. Respiratory effort normal. No respiratory distress. No conversational dyspnea.  Cardiovascular system: S1 & S2 heard, RRR. No murmurs. No pedal edema. Gastrointestinal system: Abdomen is nondistended, soft and nontender. Normal bowel sounds heard. Central nervous system: Alert and oriented. No focal neurological deficits. Speech clear.  Extremities: Symmetric in appearance  Skin: No rashes, lesions or ulcers on exposed skin  Psychiatry: Judgement and insight appear normal. Mood & affect appropriate.   Data Reviewed: I have personally reviewed following labs and imaging studies  CBC: Recent Labs  Lab 09/02/21 2153 09/02/21 2212  WBC 5.3  --   NEUTROABS 2.9  --   HGB 16.3 16.7  HCT 49.6 49.0  MCV 91.5  --   PLT 171  --    Basic Metabolic Panel: Recent Labs  Lab 09/02/21 2153 09/02/21 2212  NA 137 140  K 4.2 4.1  CL 104 105  CO2 25  --   GLUCOSE 107* 94  BUN 22 24*  CREATININE 1.29* 1.10  CALCIUM 9.9  --    GFR: Estimated Creatinine Clearance: 91.5  mL/min (by C-G formula based on SCr of 1.1 mg/dL). Liver Function Tests: Recent Labs  Lab 09/02/21 2153  AST 25  ALT 30  ALKPHOS 73  BILITOT 1.3*  PROT 6.9  ALBUMIN 3.7   No results for input(s): LIPASE, AMYLASE in the last 168 hours. No results for input(s): AMMONIA in the last 168 hours. Coagulation Profile: Recent Labs  Lab 09/02/21 2153  INR 1.3*   Cardiac Enzymes: No results for input(s): CKTOTAL, CKMB, CKMBINDEX, TROPONINI in the last 168 hours. BNP (last 3 results) No results for input(s): PROBNP in the last 8760 hours. HbA1C: Recent Labs    09/03/21 0240  HGBA1C 5.2   CBG: Recent Labs  Lab 09/02/21 2130 09/03/21 0703 09/03/21 1233  GLUCAP 109* 133* 150*   Lipid Profile: Recent Labs    09/03/21 0240  CHOL 142  HDL 42  LDLCALC 92  TRIG 40  CHOLHDL 3.4   Thyroid Function Tests: No results for input(s): TSH, T4TOTAL, FREET4, T3FREE, THYROIDAB in the last 72 hours. Anemia Panel: No results for input(s): VITAMINB12, FOLATE, FERRITIN, TIBC, IRON, RETICCTPCT in the last 72 hours. Sepsis Labs: No results for input(s): PROCALCITON, LATICACIDVEN in the last 168 hours.  Recent Results (from the past 240 hour(s))  Resp Panel by RT-PCR (Flu A&B, Covid) Nasopharyngeal Swab     Status: None   Collection Time: 09/02/21  9:42 PM   Specimen: Nasopharyngeal Swab; Nasopharyngeal(NP) swabs in vial transport medium  Result Value Ref Range Status   SARS Coronavirus 2 by RT PCR NEGATIVE NEGATIVE Final    Comment: (NOTE) SARS-CoV-2 target nucleic acids are NOT DETECTED.  The SARS-CoV-2 RNA is generally detectable in upper respiratory specimens during the acute phase of infection. The lowest concentration of SARS-CoV-2 viral copies this assay can detect is 138 copies/mL. A negative result does not preclude SARS-Cov-2 infection and should not be used as the sole basis for treatment or other patient management decisions. A negative result may occur with  improper  specimen collection/handling, submission of specimen other than nasopharyngeal swab, presence of viral mutation(s) within the areas targeted by this assay, and inadequate number of viral copies(<138 copies/mL). A negative result must be combined with clinical observations, patient history, and epidemiological information. The expected result is Negative.  Fact Sheet for Patients:  BloggerCourse.com  Fact Sheet for Healthcare Providers:  SeriousBroker.it  This test is no t yet approved or cleared by the [place] FDA and  has been authorized for detection and/or diagnosis of SARS-CoV-2 by FDA under an Emergency Use Authorization (EUA). This EUA will remain  in effect (meaning this test can be used) for the duration of the COVID-19 declaration under Section 564(b)(1) of the Act, 21 U.S.C.section 360bbb-3(b)(1), unless the authorization is terminated  or revoked sooner.       Influenza A by PCR NEGATIVE NEGATIVE Final   Influenza B by PCR NEGATIVE NEGATIVE Final    Comment: (NOTE) The Xpert Xpress SARS-CoV-2/FLU/RSV plus assay is intended as an  aid in the diagnosis of influenza from Nasopharyngeal swab specimens and should not be used as a sole basis for treatment. Nasal washings and aspirates are unacceptable for Xpert Xpress SARS-CoV-2/FLU/RSV testing.  Fact Sheet for Patients: BloggerCourse.com  Fact Sheet for Healthcare Providers: SeriousBroker.it  This test is not yet approved or cleared by the [place] FDA and has been authorized for detection and/or diagnosis of SARS-CoV-2 by FDA under an Emergency Use Authorization (EUA). This EUA will remain in effect (meaning this test can be used) for the duration of the COVID-19 declaration under Section 564(b)(1) of the Act, 21 U.S.C. section 360bbb-3(b)(1), unless the authorization is terminated or revoked.  Performed at  [facility], [address] [place], [place] [address]       Radiology Studies: CT HEAD WO CONTRAST ( )  Result Date: 09/04/2021 CLINICAL DATA:  65 year old male with neurologic deficit. Left side weakness. EXAM: CT HEAD WITHOUT CONTRAST TECHNIQUE: Contiguous axial images were obtained from the base of the skull through the vertex without intravenous contrast. COMPARISON:  Head CT 09/02/2021. FINDINGS: Brain: Cavum septum pellucidum, normal variant. Cerebral volume is within normal limits for age. No midline shift, mass effect, or evidence of intracranial mass lesion. No ventriculomegaly. No acute intracranial hemorrhage identified. Supratentorial gray-white matter differentiation appears stable and within normal limits. But heterogeneous hypodensity in the pons is difficult to exclude (series 3, image 12), although seems more pronounced on the left (would cause right side symptoms). No acute cortically based cerebral or cerebellar infarct identified. Vascular: Calcified atherosclerosis at the skull base. No suspicious intracranial vascular hyperdensity. Skull: Stable, negative. Sinuses/Orbits: Visualized paranasal sinuses and mastoids are stable and well aerated. Other: No acute orbit or scalp soft tissue finding. IMPRESSION: 1. Difficult to exclude age indeterminate small vessel ischemia in the pons, Brain MRI would best evaluate further if symptoms persist. 2. Otherwise negative for age noncontrast CT appearance of the brain. Electronically Signed   By: Odessa Fleming M.D.   On: 09/04/2021 08:59   CT HEAD WO CONTRAST  Result Date: 09/02/2021 CLINICAL DATA:  Neuro deficit, acute, stroke suspected Left-sided body weakness.  Confusion and dizziness. EXAM: CT HEAD WITHOUT CONTRAST TECHNIQUE: Contiguous axial images were obtained from the base of the skull through the vertex without intravenous contrast. COMPARISON:  None. FINDINGS: Brain: No intracranial hemorrhage, mass effect, or midline shift. No  hydrocephalus. Incidental cavum septum pellucidum, normal variant. The basilar cisterns are patent. No evidence of territorial infarct or acute ischemia. No extra-axial or intracranial fluid collection. Vascular: No hyperdense vessel or unexpected calcification. Skull: Normal. Negative for fracture or focal lesion. Sinuses/Orbits: Paranasal sinuses and mastoid air cells are clear. The visualized orbits are unremarkable. Other: None. IMPRESSION: Negative noncontrast head CT. Electronically Signed   By: Narda Rutherford M.D.   On: 09/02/2021 22:26   VAS [place] TRANSCRANIAL DOPPLER W BUBBLES  Result Date: 09/03/2021  Transcranial Doppler with Bubble Patient Name:  IBRAHIM MCPHEETERS  Date of Exam:   09/03/2021 Medical Rec #: [id number]     Accession #:    [id number] Date of Birth: Aug 24, 1956     Patient Gender: M Patient Age:   89 years Exam Location:  [facility] Procedure:      VAS [place] TRANSCRANIAL DOPPLER W BUBBLES Referring Phys: Gevena Mart --------------------------------------------------------------------------------  Indications: Stroke. Performing Technologist: [place] Ponder RVS  Examination Guidelines: A complete evaluation includes B-mode imaging, spectral Doppler, color Doppler, and power Doppler as needed of all accessible portions of each vessel. Bilateral  testing is considered an integral part of a complete examination. Limited examinations for reoccurring indications may be performed as noted.  Summary: No HITS at rest or during Valsalva. Negative transcranial Doppler Bubble study with no evidence of right to left intracardiac communication.  A vascular evaluation was performed. The right middle cerebral artery was studied. An IV was inserted into the patient's left forearm . Verbal informed consent was obtained.  *See table(s) above for TCD measurements and observations.    Preliminary    ECHOCARDIOGRAM COMPLETE  Result Date: 09/03/2021    ECHOCARDIOGRAM REPORT   Patient Name:   STEPHEN BARUCH  Date of Exam: 09/03/2021 Medical Rec #:  [id number]    Height:       73.0 in Accession #:    [id number]   Weight:       248.0 lb Date of Birth:  [birth year]    BSA:          2.358 m Patient Age:    64 years     BP:           98/71 mmHg Patient Gender: M            HR:           78 bpm. Exam Location:  Inpatient Procedure: 2D Echo, Cardiac Doppler and Color Doppler Indications:    TIA G45.9  History:        Patient has no prior history of Echocardiogram examinations.                 Pacemaker.  Sonographer:    Roosvelt Maser RDCS Referring Phys: [id number] VASUNDHRA RATHORE IMPRESSIONS  1. Left ventricular ejection fraction, by estimation, is 40 to 45%. The left ventricle has mildly decreased function. The left ventricle demonstrates global hypokinesis. The left ventricular internal cavity size was mildly dilated. Left ventricular diastolic parameters are consistent with Grade I diastolic dysfunction (impaired relaxation).  2. Right ventricular systolic function is normal. The right ventricular size is normal.  3. The mitral valve is normal in structure. No evidence of mitral valve regurgitation. No evidence of mitral stenosis.  4. The aortic valve is normal in structure. Aortic valve regurgitation is trivial. No aortic stenosis is present.  5. There is mild dilatation of the aortic root, measuring 40 mm.  6. The inferior vena cava is dilated in size with >50% respiratory variability, suggesting right atrial pressure of 8 mmHg. Conclusion(s)/Recommendation(s): No intracardiac source of embolism detected on this transthoracic study. A transesophageal echocardiogram is recommended to exclude cardiac source of embolism if clinically indicated. FINDINGS  Left Ventricle: Left ventricular ejection fraction, by estimation, is 40 to 45%. The left ventricle has mildly decreased function. The left ventricle demonstrates global hypokinesis. 3D left ventricular ejection fraction analysis performed but not reported based on interpreter  judgement due to suboptimal quality. The left ventricular internal cavity size was mildly dilated. There is no left ventricular hypertrophy. Left ventricular diastolic parameters are consistent with Grade I diastolic dysfunction (impaired relaxation). Right Ventricle: The right ventricular size is normal. No increase in right ventricular wall thickness. Right ventricular systolic function is normal. Left Atrium: Left atrial size was normal in size. Right Atrium: Right atrial size was normal in size. Pericardium: There is no evidence of pericardial effusion. Mitral Valve: The mitral valve is normal in structure. No evidence of mitral valve regurgitation. No evidence of mitral valve stenosis. Tricuspid Valve: The tricuspid valve is normal in structure. Tricuspid valve regurgitation is mild . No evidence  of tricuspid stenosis. Aortic Valve: The aortic valve is normal in structure. Aortic valve regurgitation is trivial. No aortic stenosis is present. Aortic valve mean gradient measures 2.0 mmHg. Aortic valve peak gradient measures 3.5 mmHg. Aortic valve area, by VTI measures 2.70 cm. Pulmonic Valve: The pulmonic valve was normal in structure. Pulmonic valve regurgitation is not visualized. No evidence of pulmonic stenosis. Aorta: The aortic root is normal in size and structure. There is mild dilatation of the aortic root, measuring 40 mm. Venous: The inferior vena cava is dilated in size with greater than 50% respiratory variability, suggesting right atrial pressure of 8 mmHg. IAS/Shunts: There is redundancy of the interatrial septum. No atrial level shunt detected by color flow Doppler. Additional Comments: A device lead is visualized in the right ventricle.  LEFT VENTRICLE PLAX 2D LVIDd:         5.40 cm      Diastology LVIDs:         4.30 cm      LV e' medial:    6.53 cm/s LV PW:         0.90 cm      LV E/e' medial:  4.5 LV IVS:        1.00 cm      LV e' lateral:   12.10 cm/s LVOT diam:     2.20 cm      LV E/e'  lateral: 2.4 LV SV:         56 LV SV Index:   24 LVOT Area:     3.80 cm                              3D Volume EF: LV Volumes (MOD)            3D EF:        49 % LV vol d, MOD A2C: 121.0 ml LV EDV:       138 ml LV vol d, MOD A4C: 145.0 ml LV ESV:       71 ml LV vol s, MOD A2C: 62.0 ml  LV SV:        67 ml LV vol s, MOD A4C: 81.2 ml LV SV MOD A2C:     59.0 ml LV SV MOD A4C:     145.0 ml LV SV MOD BP:      72.0 ml RIGHT VENTRICLE            IVC RV Basal diam:  3.90 cm    IVC diam: 2.50 cm RV S prime:     7.51 cm/s TAPSE (M-mode): 2.0 cm LEFT ATRIUM           Index        RIGHT ATRIUM           Index LA diam:      3.70 cm 1.57 cm/m   RA Area:     22.50 cm LA Vol (A2C): 64.4 ml 27.31 ml/m  RA Volume:   74.60 ml  31.64 ml/m  AORTIC VALVE AV Area (Vmax):    3.06 cm AV Area (Vmean):   2.92 cm AV Area (VTI):     2.70 cm AV Vmax:           93.00 cm/s AV Vmean:          64.200 cm/s AV VTI:            0.207 m AV Peak Grad:  3.5 mmHg AV Mean Grad:      2.0 mmHg LVOT Vmax:         74.90 cm/s LVOT Vmean:        49.300 cm/s LVOT VTI:          0.147 m LVOT/AV VTI ratio: 0.71  AORTA Ao Root diam: 4.00 cm MITRAL VALVE               TRICUSPID VALVE MV Area (PHT): 2.62 cm    TR Peak grad:   14.3 mmHg MV Decel Time: 289 msec    TR Vmax:        189.00 cm/s MV E velocity: 29.30 cm/s MV A velocity: 59.20 cm/s  SHUNTS MV E/A ratio:  0.49        Systemic VTI:  0.15 m                            Systemic Diam: 2.20 cm Donato Schultz MD Electronically signed by Donato Schultz MD Signature Date/Time: 09/03/2021/3:04:41 PM    Final    VAS [place] LOWER EXTREMITY VENOUS (DVT)  Result Date: 09/03/2021  Lower Venous DVT Study Patient Name:  JEFFREE CAZEAU  Date of Exam:   09/03/2021 Medical Rec #: [id number]     Accession #:    [id number] Date of Birth: [birth year]     Patient Gender: M Patient Age:   61 years Exam Location:  [facility] Procedure:      VAS [place] LOWER EXTREMITY VENOUS (DVT) Referring Phys: Angelique Blonder WOLFE  --------------------------------------------------------------------------------  Indications: Stroke.  Comparison Study: no prior Performing Technologist: [place] Ponder RVS  Examination Guidelines: A complete evaluation includes B-mode imaging, spectral Doppler, color Doppler, and power Doppler as needed of all accessible portions of each vessel. Bilateral testing is considered an integral part of a complete examination. Limited examinations for reoccurring indications may be performed as noted. The reflux portion of the exam is performed with the patient in reverse Trendelenburg.  +---------+---------------+---------+-----------+----------+--------------+ RIGHT    CompressibilityPhasicitySpontaneityPropertiesThrombus Aging +---------+---------------+---------+-----------+----------+--------------+ CFV      Full           Yes      Yes                                 +---------+---------------+---------+-----------+----------+--------------+ SFJ      Full                                                        +---------+---------------+---------+-----------+----------+--------------+ FV Prox  Full                                                        +---------+---------------+---------+-----------+----------+--------------+ FV Mid   Full                                                        +---------+---------------+---------+-----------+----------+--------------+ FV DistalFull                                                        +---------+---------------+---------+-----------+----------+--------------+  PFV      Full                                                        +---------+---------------+---------+-----------+----------+--------------+ POP      Full           Yes      Yes                                 +---------+---------------+---------+-----------+----------+--------------+ PTV      Full                                                         +---------+---------------+---------+-----------+----------+--------------+ PERO     Full                                                        +---------+---------------+---------+-----------+----------+--------------+   +---------+---------------+---------+-----------+----------+--------------+ LEFT     CompressibilityPhasicitySpontaneityPropertiesThrombus Aging +---------+---------------+---------+-----------+----------+--------------+ CFV      Full           Yes      Yes                                 +---------+---------------+---------+-----------+----------+--------------+ SFJ      Full                                                        +---------+---------------+---------+-----------+----------+--------------+ FV Prox  Full                                                        +---------+---------------+---------+-----------+----------+--------------+ FV Mid   Full                                                        +---------+---------------+---------+-----------+----------+--------------+ FV DistalFull                                                        +---------+---------------+---------+-----------+----------+--------------+ PFV      Full                                                        +---------+---------------+---------+-----------+----------+--------------+  POP      Full           Yes      Yes                                 +---------+---------------+---------+-----------+----------+--------------+ PTV      Full                                                        +---------+---------------+---------+-----------+----------+--------------+ PERO     Full                                                        +---------+---------------+---------+-----------+----------+--------------+     Summary: BILATERAL: - No evidence of deep vein thrombosis seen in the lower extremities, bilaterally. -No  evidence of popliteal cyst, bilaterally.   *See table(s) above for measurements and observations. Electronically signed by Waverly Ferrari MD on 09/03/2021 at 4:00:06 PM.    Final       Scheduled Meds:   stroke: mapping our early stages of recovery book   Does not apply Once   atorvastatin  40 mg Oral Daily   rivaroxaban  20 mg Oral QPM   tamsulosin  0.4 mg Oral QPM   Continuous Infusions:   LOS: 0 days      Time spent: 30 minutes   Noralee Stain, DO [facility] 09/04/2021, 1:43 PM   Available via Epic secure chat 7am-7pm After these hours, please refer to coverage provider listed on [email or web address]

## 2021-09-04 NOTE — Progress Notes (Signed)
SLP Cancellation Note  Patient Details Name: Bobby Griffin MRN: [id number] DOB: 16-Jul-1956   Cancelled treatment:       Reason Eval/Treat Not Completed: SLP screened, no needs identified, will sign off. Per chart review, patient's symptoms were mild paresthesis L hand with no reported cognitive impairment. Thank you for this consult!  Angela Nevin, MA, CCC-SLP Speech Therapy

## 2021-09-04 NOTE — Progress Notes (Signed)
   09/04/21 1200  Provider Notification  Provider Name/Title Noralee Stain, DO  Date Provider Notified 09/04/21  Time Provider Notified 1130  Notification Type Page  Notification Reason Change in status  Provider response See new orders  Date of Provider Response 09/04/21  Time of Provider Response 1130   Patient c/o intermittent chest pain, upper abdominal 5/10 intensity. Pain is while at rest, patient denies pain on exertion, denies shortness of breath, denies nausea. He does endorse feeling gas pain. BP 108/79, HR 63, RR 14, 99% 02.  Dr. Alvino Chapel ordered EKG, Troponins.

## 2021-09-05 DIAGNOSIS — G459 Transient cerebral ischemic attack, unspecified: Secondary | ICD-10-CM | POA: Diagnosis not present

## 2021-09-05 NOTE — Progress Notes (Signed)
STROKE TEAM PROGRESS NOTE   INTERVAL HISTORY Patient is seen in room with nobody at the bedside.   He states he is doing well and wants to go home.  He is awaiting MRI scan to be done later today.  Vital signs stable.  Neuro exam unchanged Vitals:   09/04/21 1954 09/04/21 2358 09/05/21 0356 09/05/21 0800  BP: 116/77 111/80 105/76 103/84  Pulse: 69 62 69 74  Resp: 16 15 14    Temp: (!) 97.5 F (36.4 C) (!) 97.5 F (36.4 C) (!) 97.4 F (36.3 C) 98.9 F (37.2 C)  TempSrc: Oral Oral Oral Oral  SpO2: 98% 98% 99% 99%  Weight:      Height:       CBC:  Recent Labs  Lab 09/02/21 2153 09/02/21 2212  WBC 5.3  --   NEUTROABS 2.9  --   HGB 16.3 16.7  HCT 49.6 49.0  MCV 91.5  --   PLT 171  --    Basic Metabolic Panel:  Recent Labs  Lab 09/02/21 2153 09/02/21 2212 09/04/21 1236  NA 137 140  --   K 4.2 4.1  --   CL 104 105  --   CO2 25  --   --   GLUCOSE 107* 94  --   BUN 22 24*  --   CREATININE 1.29* 1.10  --   CALCIUM 9.9  --   --   MG  --   --  1.8   Lipid Panel:  Recent Labs  Lab 09/03/21 0240  CHOL 142  TRIG 40  HDL 42  CHOLHDL 3.4  VLDL 8  LDLCALC 92   HgbA1c:  Recent Labs  Lab 09/03/21 0240  HGBA1C 5.2   Urine Drug Screen:  Recent Labs  Lab 09/02/21 2202  LABOPIA NONE DETECTED  COCAINSCRNUR NONE DETECTED  LABBENZ NONE DETECTED  AMPHETMU NONE DETECTED  THCU NONE DETECTED  LABBARB NONE DETECTED    Alcohol Level  Recent Labs  Lab 09/02/21 2153  ETH <10    IMAGING past 24 hours No results found.  PHYSICAL EXAM General: Patient is an alert, well-nourished male in no acute distress  . Afebrile. Head is nontraumatic. Neck is supple without bruit.    Cardiac exam no murmur or gallop. Lungs are clear to auscultation. Distal pulses are well felt.  Neurological Exam ;  Awake  Alert oriented x 3. Normal speech and language.eye movements full without nystagmus.fundi were not visualized. Vision acuity and fields appear normal. Hearing is normal.  Palatal movements are normal. Face symmetric. Tongue midline. Normal strength, tone, reflexes and coordination.  Diminished fine finger movements on the left.  Orbits right over left upper extremity.  Normal sensation. Gait deferred.  ASSESSMENT/PLAN [name] is an ambidextrous 65 y.o. male with history of nonischemic CHF with AICD, paroxysmal a-fib/a-flutter, DVT, PE on home Xarelto, HTN, HLD, T2DM and BPH presenting with acute onset weakness and sensory deficits of the left hand.  This occurred when patient was [place] from his [place] to [place], and patient came to the hospital after onset of symptoms.  Of note, he held two doses of his Xarelto earlier this week (after discussion with his cardiologist) so that he could donate blood.  Head CT on arrival showed no acute changes, and MRI is to be performed on Monday when his AICD can be deactivated for the study.  Further workup will include lower extremity venous dopplers, TTE and transcranial doppler with bubble study.  Stroke:  Suspect right parietal  infarct not seen on CT scan CT head No acute abnormality.  MRI  pending 2D Echo diminished ejection fraction 40 to 45% with global hypokinesis.  No clot. Transcranial doppler with bubble study negative for right-to-left shunt LDL 92 HgbA1c 5.2 VTE prophylaxis - fully anticoagulated on Xarelto    Diet   Diet Heart Room service appropriate? Yes; Fluid consistency: Thin   Xarelto (rivaroxaban) daily prior to admission, now on Xarelto (rivaroxaban) daily.  Therapy recommendations:  pending Disposition:  to home  Hypertension Home meds:  carvedilol 12.5 mg daily Stable Permissive hypertension (OK if <180) but gradually normalize in 5-7 days Long-term BP goal normotensive  Hyperlipidemia Home meds:  pravastatin 20 mg daily, atorvastain 40 mg daily given in hospital LDL 92, goal < 70 High intensity statin continued Continue statin at discharge  Diabetes type II  Controlled Home meds:  Jardiance 10 mg daily HgbA1c 5.2, goal < 7.0 CBGs Recent Labs    09/02/21 2130 09/03/21 0703 09/03/21 1233  GLUCAP 109* 133* 150*    SSI  Other Stroke Risk Factors Obesity, Body mass index is 32.72 kg/m., BMI >/= 30 associated with increased stroke risk, recommend weight loss, diet and exercise as appropriate  Congestive heart failure  Other Active Problems Paroxysmal atrial fibrillation/atrial flutter Continue anticoagulation with Xarelto Continue inpatient cardiac monitoring  History of DVT and PE Continue anticoagulation with Xarelto Lower extremity venous doppler study pending  BPH Continue home tamsulosin  Hospital day # 0   Patient presented with sudden onset of left upper extremity incoordination, numbness weakness and some facial paresthesias likely due to right parietal cortical embolic infarct.  He is already on Xarelto daily but has been taking it only with light snack.  He was counseled to take it consistently at the same time with a proper meal.  Continue ongoing stroke work-up.  Patient will hopefully get his MRI today since he has a ICD.  It could not be done over the weekend.  Aggressive risk factor modification.  Greater than 50% time during this 25-minute visit were spent in counseling and coordination of care and discussion with care team.  Discussed with Dr.Choi.  Discharge home after MRI.  Follow-up as an outpatient in stroke clinic in 2 months.  Stroke team will sign off.  Kindly call for questions  Delia Heady, MD Medical Director [facility] Pager: [phone] 09/05/2021 11:10 AM   To contact Stroke Continuity provider, please refer to [email or web address]. After hours, contact General Neurology

## 2021-09-05 NOTE — Progress Notes (Signed)
PROGRESS NOTE    Bobby Griffin  JKD:[id number] DOB: 11/01/1956 DOA: 09/02/2021 PCP: Pcp, No     Brief Narrative:  Bobby Griffin is a 65 y.o. male with a PMH significant for nonischemic cardiomyopathy status post ICD placement in 2017, PAF, DVTs, HTN, HLD, noninsulin-dependent T2 DM, BPH. They presented from home to the ED on 09/02/2021 with facial droop and abnormal hand sensation/strength on left starting acutely today. In the ED, it was found that they had TIA. They were treated with anticoagulation, permissive hypertension, PT/OT, neurology consult. Patient was admitted to medicine service for further workup and management of TIA as outlined in detail below.  New events last 24 hours / Subjective: Patient has been waiting to get his MRI brain completed.  This was supposed to be done today but was notified by RN that MRI suite could not accommodate his schedule today.  He is scheduled for MRI with cardiology to evaluate his ICD tomorrow afternoon.  Also discussed with Dr. Pearlean Brownie and patient regarding option of patient being discharged and to follow-up with MRI as outpatient.  As patient is not local to this area and currently lives in [place], he elected to stay overnight to get MRI completed prior to discharge tomorrow. Otherwise, physically he is feeling well without any complaints.  Had some episodes of chest pain which relieved with Tums.  Assessment & Plan:   Principal Problem:   TIA (transient ischemic attack) Active Problems:   Chronic systolic CHF (congestive heart failure) (HCC)   AF (paroxysmal atrial fibrillation) (HCC)   Essential hypertension   Hyperlipidemia   TIA -Appreciate neurology -MRI brain pending, hopefully will be done tomorrow.  Due to his ICD, awaiting for MRI technician to be available  -PT OT SLP recommended no further follow-up  Chest pain -Likely GI related -Resolved   HFrEF -Last echo 04/2019 showed ejection fraction 30 to 35%.  ICD implantation in  2017. Home meds include Entresto, carvedilol, eplerenone -Repeat echocardiogram showed EF 40 to 45%, grade 1 diastolic dysfunction -Follow-up with cardiology as outpatient   PAF -Continue Xarelto   HTN -Continue holding home meds to allow permissive hypertension   Type 2 diabetes -A1c 5.2.  Well controlled  Hyperlipidemia -Lipitor   DVT prophylaxis:   rivaroxaban (XARELTO) tablet 20 mg  Code Status: Full  Family Communication: None at bedside Disposition Plan:  Status is: Observation  The patient remains OBS appropriate. Awaiting MRI.     Consultants:  Neurology  Procedures:  None   Antimicrobials:  Anti-infectives (From admission, onward)    None        Objective: Vitals:   09/04/21 2358 09/05/21 0356 09/05/21 0800 09/05/21 1106  BP: 111/80 105/76 103/84 112/79  Pulse: 62 69 74 67  Resp: 15 14  19   Temp: (!) 97.5 F (36.4 C) (!) 97.4 F (36.3 C) 98.9 F (37.2 C) 98.2 F (36.8 C)  TempSrc: Oral Oral Oral Oral  SpO2: 98% 99% 99% 99%  Weight:      Height:        Intake/Output Summary (Last 24 hours) at 09/05/2021 1419 Last data filed at 09/05/2021 0858 Gross per 24 hour  Intake 310.35 ml  Output 1650 ml  Net -1339.65 ml    Filed Weights   09/02/21 2128 09/03/21 0400  Weight: 120 kg 112.5 kg    Examination:  General exam: Appears calm and comfortable  Respiratory system: Clear to auscultation. Respiratory effort normal. No respiratory distress. No conversational dyspnea.  Cardiovascular system: S1 &  S2 heard, RRR. No murmurs. No pedal edema. Gastrointestinal system: Abdomen is nondistended, soft and nontender. Normal bowel sounds heard. Central nervous system: Alert and oriented. No focal neurological deficits. Speech clear.  Extremities: Symmetric in appearance  Skin: No rashes, lesions or ulcers on exposed skin  Psychiatry: Judgement and insight appear normal. Mood & affect appropriate.   Data Reviewed: I have personally reviewed  following labs and imaging studies  CBC: Recent Labs  Lab 09/02/21 2153 09/02/21 2212  WBC 5.3  --   NEUTROABS 2.9  --   HGB 16.3 16.7  HCT 49.6 49.0  MCV 91.5  --   PLT 171  --     Basic Metabolic Panel: Recent Labs  Lab 09/02/21 2153 09/02/21 2212 09/04/21 1236  NA 137 140  --   K 4.2 4.1  --   CL 104 105  --   CO2 25  --   --   GLUCOSE 107* 94  --   BUN 22 24*  --   CREATININE 1.29* 1.10  --   CALCIUM 9.9  --   --   MG  --   --  1.8    GFR: Estimated Creatinine Clearance: 91.5 mL/min (by C-G formula based on SCr of 1.1 mg/dL). Liver Function Tests: Recent Labs  Lab 09/02/21 2153  AST 25  ALT 30  ALKPHOS 73  BILITOT 1.3*  PROT 6.9  ALBUMIN 3.7    No results for input(s): LIPASE, AMYLASE in the last 168 hours. No results for input(s): AMMONIA in the last 168 hours. Coagulation Profile: Recent Labs  Lab 09/02/21 2153  INR 1.3*    Cardiac Enzymes: No results for input(s): CKTOTAL, CKMB, CKMBINDEX, TROPONINI in the last 168 hours. BNP (last 3 results) No results for input(s): PROBNP in the last 8760 hours. HbA1C: Recent Labs    09/03/21 0240  HGBA1C 5.2    CBG: Recent Labs  Lab 09/02/21 2130 09/03/21 0703 09/03/21 1233  GLUCAP 109* 133* 150*    Lipid Profile: Recent Labs    09/03/21 0240  CHOL 142  HDL 42  LDLCALC 92  TRIG 40  CHOLHDL 3.4    Thyroid Function Tests: No results for input(s): TSH, T4TOTAL, FREET4, T3FREE, THYROIDAB in the last 72 hours. Anemia Panel: No results for input(s): VITAMINB12, FOLATE, FERRITIN, TIBC, IRON, RETICCTPCT in the last 72 hours. Sepsis Labs: No results for input(s): PROCALCITON, LATICACIDVEN in the last 168 hours.  Recent Results (from the past 240 hour(s))  Resp Panel by RT-PCR (Flu A&B, Covid) Nasopharyngeal Swab     Status: None   Collection Time: 09/02/21  9:42 PM   Specimen: Nasopharyngeal Swab; Nasopharyngeal(NP) swabs in vial transport medium  Result Value Ref Range Status   SARS  Coronavirus 2 by RT PCR NEGATIVE NEGATIVE Final    Comment: (NOTE) SARS-CoV-2 target nucleic acids are NOT DETECTED.  The SARS-CoV-2 RNA is generally detectable in upper respiratory specimens during the acute phase of infection. The lowest concentration of SARS-CoV-2 viral copies this assay can detect is 138 copies/mL. A negative result does not preclude SARS-Cov-2 infection and should not be used as the sole basis for treatment or other patient management decisions. A negative result may occur with  improper specimen collection/handling, submission of specimen other than nasopharyngeal swab, presence of viral mutation(s) within the areas targeted by this assay, and inadequate number of viral copies(<138 copies/mL). A negative result must be combined with clinical observations, patient history, and epidemiological information. The expected result is Negative.  Fact Sheet for Patients:  BloggerCourse.com  Fact Sheet for Healthcare Providers:  SeriousBroker.it  This test is no t yet approved or cleared by the [place] FDA and  has been authorized for detection and/or diagnosis of SARS-CoV-2 by FDA under an Emergency Use Authorization (EUA). This EUA will remain  in effect (meaning this test can be used) for the duration of the COVID-19 declaration under Section 564(b)(1) of the Act, 21 U.S.C.section 360bbb-3(b)(1), unless the authorization is terminated  or revoked sooner.       Influenza A by PCR NEGATIVE NEGATIVE Final   Influenza B by PCR NEGATIVE NEGATIVE Final    Comment: (NOTE) The Xpert Xpress SARS-CoV-2/FLU/RSV plus assay is intended as an aid in the diagnosis of influenza from Nasopharyngeal swab specimens and should not be used as a sole basis for treatment. Nasal washings and aspirates are unacceptable for Xpert Xpress SARS-CoV-2/FLU/RSV testing.  Fact Sheet for  Patients: BloggerCourse.com  Fact Sheet for Healthcare Providers: SeriousBroker.it  This test is not yet approved or cleared by the [place] FDA and has been authorized for detection and/or diagnosis of SARS-CoV-2 by FDA under an Emergency Use Authorization (EUA). This EUA will remain in effect (meaning this test can be used) for the duration of the COVID-19 declaration under Section 564(b)(1) of the Act, 21 U.S.C. section 360bbb-3(b)(1), unless the authorization is terminated or revoked.  Performed at [facility], [address], [place], [place] [address]        Radiology Studies: CT HEAD WO CONTRAST ( )  Result Date: 09/04/2021 CLINICAL DATA:  65 year old male with neurologic deficit. Left side weakness. EXAM: CT HEAD WITHOUT CONTRAST TECHNIQUE: Contiguous axial images were obtained from the base of the skull through the vertex without intravenous contrast. COMPARISON:  Head CT 09/02/2021. FINDINGS: Brain: Cavum septum pellucidum, normal variant. Cerebral volume is within normal limits for age. No midline shift, mass effect, or evidence of intracranial mass lesion. No ventriculomegaly. No acute intracranial hemorrhage identified. Supratentorial gray-white matter differentiation appears stable and within normal limits. But heterogeneous hypodensity in the pons is difficult to exclude (series 3, image 12), although seems more pronounced on the left (would cause right side symptoms). No acute cortically based cerebral or cerebellar infarct identified. Vascular: Calcified atherosclerosis at the skull base. No suspicious intracranial vascular hyperdensity. Skull: Stable, negative. Sinuses/Orbits: Visualized paranasal sinuses and mastoids are stable and well aerated. Other: No acute orbit or scalp soft tissue finding. IMPRESSION: 1. Difficult to exclude age indeterminate small vessel ischemia in the pons, Brain MRI would best  evaluate further if symptoms persist. 2. Otherwise negative for age noncontrast CT appearance of the brain. Electronically Signed   By: Odessa Fleming M.D.   On: 09/04/2021 08:59   ECHOCARDIOGRAM COMPLETE  Result Date: 09/03/2021    ECHOCARDIOGRAM REPORT   Patient Name:   WOODS GANGEMI Date of Exam: 09/03/2021 Medical Rec #:  [id number]    Height:       73.0 in Accession #:    [id number]   Weight:       248.0 lb Date of Birth:  [birth year]    BSA:          2.358 m Patient Age:    64 years     BP:           98/71 mmHg Patient Gender: M            HR:           78 bpm. Exam  Location:  Inpatient Procedure: 2D Echo, Cardiac Doppler and Color Doppler Indications:    TIA G45.9  History:        Patient has no prior history of Echocardiogram examinations.                 Pacemaker.  Sonographer:    Roosvelt Maser RDCS Referring Phys: [id number] VASUNDHRA RATHORE IMPRESSIONS  1. Left ventricular ejection fraction, by estimation, is 40 to 45%. The left ventricle has mildly decreased function. The left ventricle demonstrates global hypokinesis. The left ventricular internal cavity size was mildly dilated. Left ventricular diastolic parameters are consistent with Grade I diastolic dysfunction (impaired relaxation).  2. Right ventricular systolic function is normal. The right ventricular size is normal.  3. The mitral valve is normal in structure. No evidence of mitral valve regurgitation. No evidence of mitral stenosis.  4. The aortic valve is normal in structure. Aortic valve regurgitation is trivial. No aortic stenosis is present.  5. There is mild dilatation of the aortic root, measuring 40 mm.  6. The inferior vena cava is dilated in size with >50% respiratory variability, suggesting right atrial pressure of 8 mmHg. Conclusion(s)/Recommendation(s): No intracardiac source of embolism detected on this transthoracic study. A transesophageal echocardiogram is recommended to exclude cardiac source of embolism if clinically indicated.  FINDINGS  Left Ventricle: Left ventricular ejection fraction, by estimation, is 40 to 45%. The left ventricle has mildly decreased function. The left ventricle demonstrates global hypokinesis. 3D left ventricular ejection fraction analysis performed but not reported based on interpreter judgement due to suboptimal quality. The left ventricular internal cavity size was mildly dilated. There is no left ventricular hypertrophy. Left ventricular diastolic parameters are consistent with Grade I diastolic dysfunction (impaired relaxation). Right Ventricle: The right ventricular size is normal. No increase in right ventricular wall thickness. Right ventricular systolic function is normal. Left Atrium: Left atrial size was normal in size. Right Atrium: Right atrial size was normal in size. Pericardium: There is no evidence of pericardial effusion. Mitral Valve: The mitral valve is normal in structure. No evidence of mitral valve regurgitation. No evidence of mitral valve stenosis. Tricuspid Valve: The tricuspid valve is normal in structure. Tricuspid valve regurgitation is mild . No evidence of tricuspid stenosis. Aortic Valve: The aortic valve is normal in structure. Aortic valve regurgitation is trivial. No aortic stenosis is present. Aortic valve mean gradient measures 2.0 mmHg. Aortic valve peak gradient measures 3.5 mmHg. Aortic valve area, by VTI measures 2.70 cm. Pulmonic Valve: The pulmonic valve was normal in structure. Pulmonic valve regurgitation is not visualized. No evidence of pulmonic stenosis. Aorta: The aortic root is normal in size and structure. There is mild dilatation of the aortic root, measuring 40 mm. Venous: The inferior vena cava is dilated in size with greater than 50% respiratory variability, suggesting right atrial pressure of 8 mmHg. IAS/Shunts: There is redundancy of the interatrial septum. No atrial level shunt detected by color flow Doppler. Additional Comments: A device lead is visualized  in the right ventricle.  LEFT VENTRICLE PLAX 2D LVIDd:         5.40 cm      Diastology LVIDs:         4.30 cm      LV e' medial:    6.53 cm/s LV PW:         0.90 cm      LV E/e' medial:  4.5 LV IVS:        1.00 cm  LV e' lateral:   12.10 cm/s LVOT diam:     2.20 cm      LV E/e' lateral: 2.4 LV SV:         56 LV SV Index:   24 LVOT Area:     3.80 cm                              3D Volume EF: LV Volumes (MOD)            3D EF:        49 % LV vol d, MOD A2C: 121.0 ml LV EDV:       138 ml LV vol d, MOD A4C: 145.0 ml LV ESV:       71 ml LV vol s, MOD A2C: 62.0 ml  LV SV:        67 ml LV vol s, MOD A4C: 81.2 ml LV SV MOD A2C:     59.0 ml LV SV MOD A4C:     145.0 ml LV SV MOD BP:      72.0 ml RIGHT VENTRICLE            IVC RV Basal diam:  3.90 cm    IVC diam: 2.50 cm RV S prime:     7.51 cm/s TAPSE (M-mode): 2.0 cm LEFT ATRIUM           Index        RIGHT ATRIUM           Index LA diam:      3.70 cm 1.57 cm/m   RA Area:     22.50 cm LA Vol (A2C): 64.4 ml 27.31 ml/m  RA Volume:   74.60 ml  31.64 ml/m  AORTIC VALVE AV Area (Vmax):    3.06 cm AV Area (Vmean):   2.92 cm AV Area (VTI):     2.70 cm AV Vmax:           93.00 cm/s AV Vmean:          64.200 cm/s AV VTI:            0.207 m AV Peak Grad:      3.5 mmHg AV Mean Grad:      2.0 mmHg LVOT Vmax:         74.90 cm/s LVOT Vmean:        49.300 cm/s LVOT VTI:          0.147 m LVOT/AV VTI ratio: 0.71  AORTA Ao Root diam: 4.00 cm MITRAL VALVE               TRICUSPID VALVE MV Area (PHT): 2.62 cm    TR Peak grad:   14.3 mmHg MV Decel Time: 289 msec    TR Vmax:        189.00 cm/s MV E velocity: 29.30 cm/s MV A velocity: 59.20 cm/s  SHUNTS MV E/A ratio:  0.49        Systemic VTI:  0.15 m                            Systemic Diam: 2.20 cm Donato Schultz MD Electronically signed by Donato Schultz MD Signature Date/Time: 09/03/2021/3:04:41 PM    Final       Scheduled Meds:   stroke: mapping our early stages of recovery book   Does not apply Once   atorvastatin  40 mg Oral  Daily   rivaroxaban  20 mg Oral QPM   tamsulosin  0.4 mg Oral QPM   Continuous Infusions:  sodium chloride Stopped (09/04/21 1754)     LOS: 0 days      Time spent: 30 minutes   Noralee Stain, DO [facility] 09/05/2021, 2:19 PM   Available via Epic secure chat 7am-7pm After these hours, please refer to coverage provider listed on [email or web address]

## 2021-09-05 NOTE — TOC Initial Note (Signed)
Transition of Care [facility]) - Initial/Assessment Note    Patient Details  Name: Bobby Griffin MRN: [id number] Date of Birth: [birth year]  Transition of Care [facility]) CM/SW Contact:    Kermit Balo, RN Phone Number: 09/05/2021, 1:03 PM  Clinical Narrative:                 Patient is from [place] but in [place] visiting family. Has ICD monitor at home but no other DME. Pt denies issues with home medications. Pt has needed transportation.  PCP: Military MD through [facility]. TOC following.  Expected Discharge Plan: Home/Self Care Barriers to Discharge: Continued Medical Work up   Patient Goals and CMS Choice        Expected Discharge Plan and Services Expected Discharge Plan: Home/Self Care   Discharge Planning Services: CM Consult   Living arrangements for the past 2 months: Single Family Home                                      Prior Living Arrangements/Services Living arrangements for the past 2 months: Single Family Home Lives with:: Self Patient language and need for interpreter reviewed:: Yes Do you feel safe going back to the place where you live?: Yes        Care giver support system in place?: No (comment)   Criminal Activity/Legal Involvement Pertinent to Current Situation/Hospitalization: No - Comment as needed  Activities of Daily Living Home Assistive Devices/Equipment: None ADL Screening (condition at time of admission) Patient's cognitive ability adequate to safely complete daily activities?: Yes Is the patient deaf or have difficulty hearing?: No Does the patient have difficulty seeing, even when wearing glasses/contacts?: No Does the patient have difficulty concentrating, remembering, or making decisions?: No Patient able to express need for assistance with ADLs?: Yes Does the patient have difficulty dressing or bathing?: No Independently performs ADLs?: Yes (appropriate for developmental age) Does the patient have difficulty walking or climbing  stairs?: No Weakness of Legs: None Weakness of Arms/Hands: None  Permission Sought/Granted                  Emotional Assessment Appearance:: Appears stated age Attitude/Demeanor/Rapport: Engaged Affect (typically observed): Accepting Orientation: : Oriented to Self, Oriented to  Time, Oriented to Situation, Oriented to Place   Psych Involvement: No (comment)  Admission diagnosis:  TIA (transient ischemic attack) [G45.9] Patient Active Problem List   Diagnosis Date Noted   Chronic systolic CHF (congestive heart failure) (HCC) 09/03/2021   AF (paroxysmal atrial fibrillation) (HCC) 09/03/2021   Essential hypertension 09/03/2021   Hyperlipidemia 09/03/2021   TIA (transient ischemic attack) 09/02/2021   PCP:  Oneita Hurt, No Pharmacy:   [facility] [place], [place] - [facility] [address] [place] [address] Phone: [phone] Fax: [phone]     Social Determinants of Health (SDOH) Interventions    Readmission Risk Interventions No flowsheet data found.

## 2021-09-05 NOTE — Progress Notes (Signed)
Received pt back on unit from IR . Pt reoriented to unit.

## 2021-09-06 ENCOUNTER — Observation Stay (HOSPITAL_COMMUNITY)

## 2021-09-06 ENCOUNTER — Other Ambulatory Visit (HOSPITAL_COMMUNITY): Payer: Self-pay

## 2021-09-06 DIAGNOSIS — G459 Transient cerebral ischemic attack, unspecified: Secondary | ICD-10-CM | POA: Diagnosis not present

## 2021-09-06 IMAGING — MR MR HEAD W/O CM
9 of 10 series · 38 of 48 positions shown · non-contrast
Comparison: CT head [DATE].

CLINICAL DATA: Facial trauma

EXAM:
MRI HEAD WITHOUT CONTRAST
TECHNIQUE: Multiplanar, multiecho pulse sequences of the brain and surrounding
structures were obtained without intravenous contrast.

[Series 3: DWI · axial · 3.0mm · 1.09mm/px · z∈[-61,+98]mm · 11 of 108 slices shown (1 of 4)]
[im 1/108]
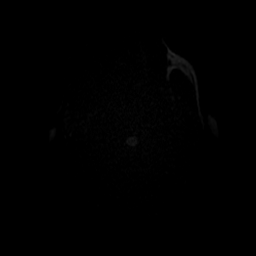
[im 11/108]
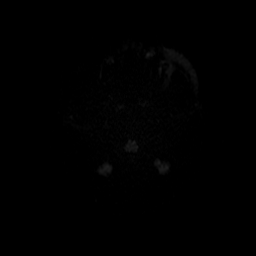
[im 22/108]
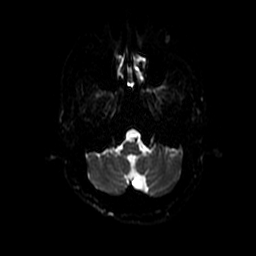
[im 33/108]
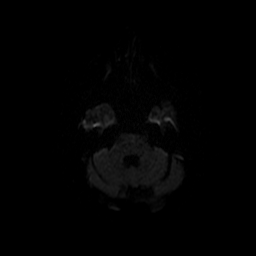
[im 43/108]
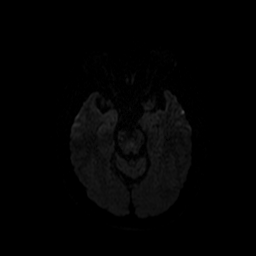
[im 54/108]
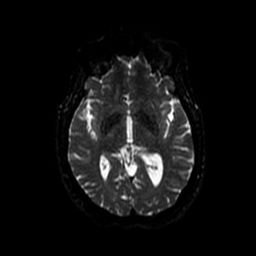
[im 65/108]
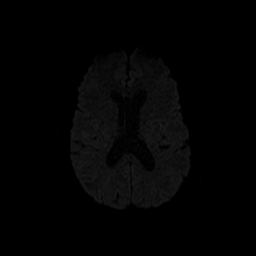
[im 75/108]
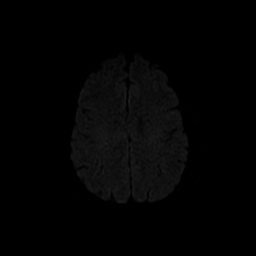
[im 86/108]
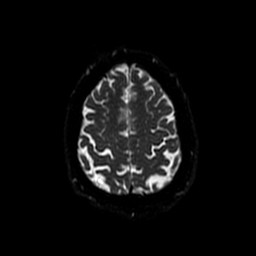
[im 97/108]
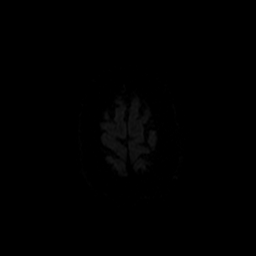
[im 108/108]
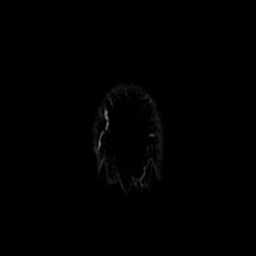

[Series 4: DWI · coronal · 5.0mm · 1.09mm/px · 7 of 80 slices shown (2 of 4)]
[im 1/80]
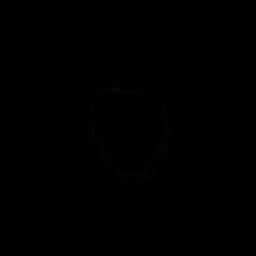
[im 14/80]
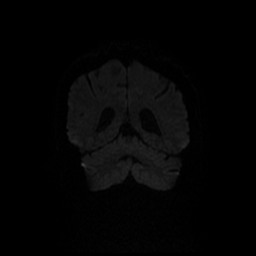
[im 27/80]
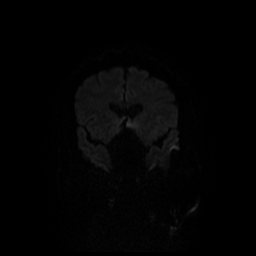
[im 40/80]
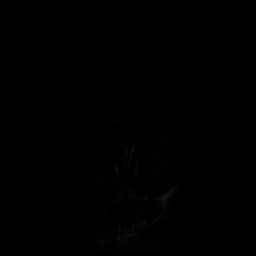
[im 53/80]
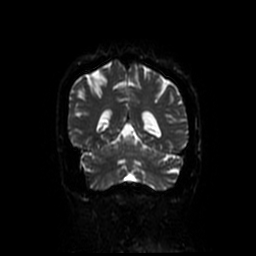
[im 66/80]
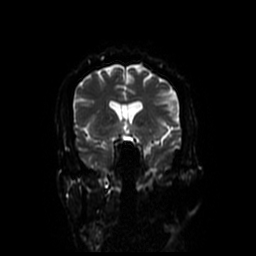
[im 80/80]
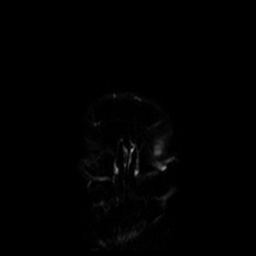

[Series 5: T1 · sagittal · 5.0mm · 0.49mm/px · 2 of 26 slices shown (1 of 2)]
[im 1/26]
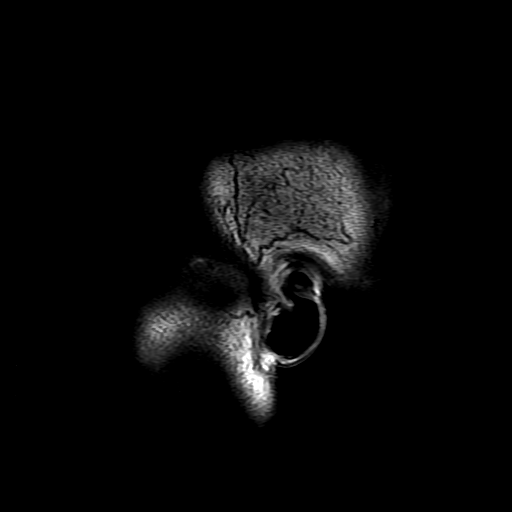
[im 26/26]
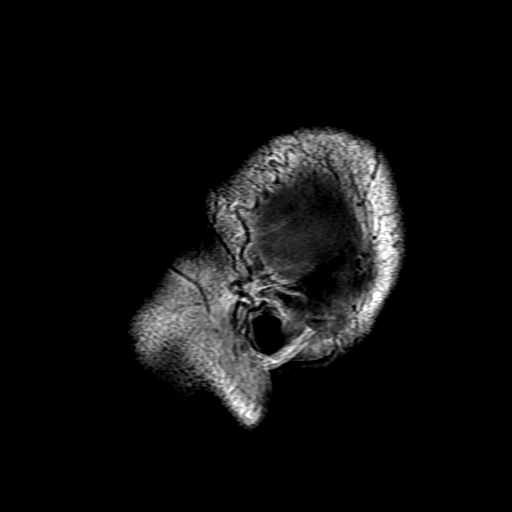

[Series 6: T2 · axial · 5.0mm · 0.49mm/px · z∈[-55,+101]mm · 2 of 27 slices shown (1 of 2)]
[im 1/27]
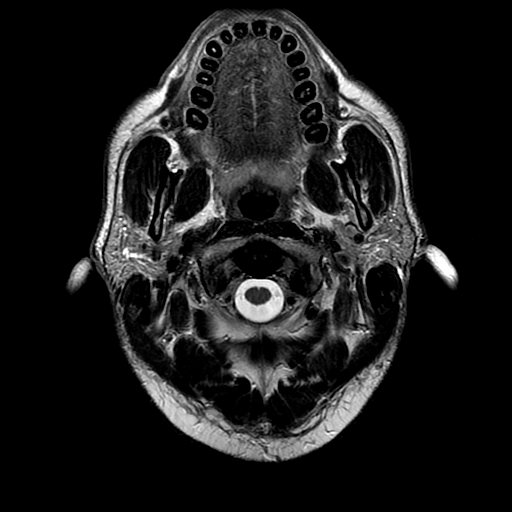
[im 27/27]
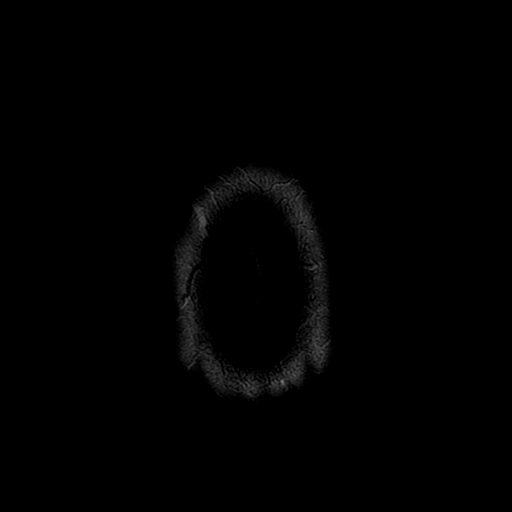

[Series 7: FLAIR · axial · 3.0mm · 0.49mm/px · z∈[-55,+101]mm · 2 of 27 slices shown]
[im 1/27]
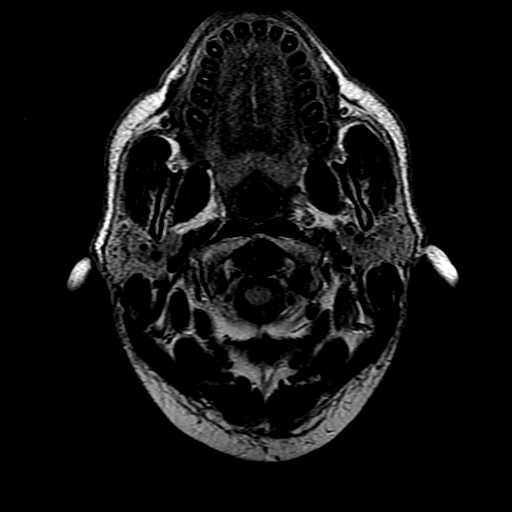
[im 27/27]
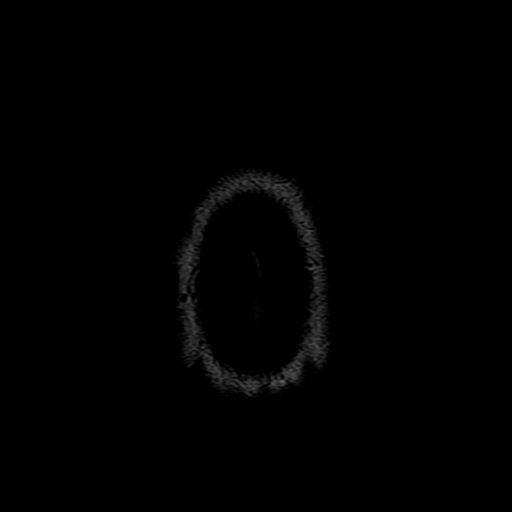

[Series 9: T1 · axial · 3.0mm · 0.49mm/px · z∈[-56,-40]mm · 2 of 108 slices shown (2 of 2)]
[im 1/108]
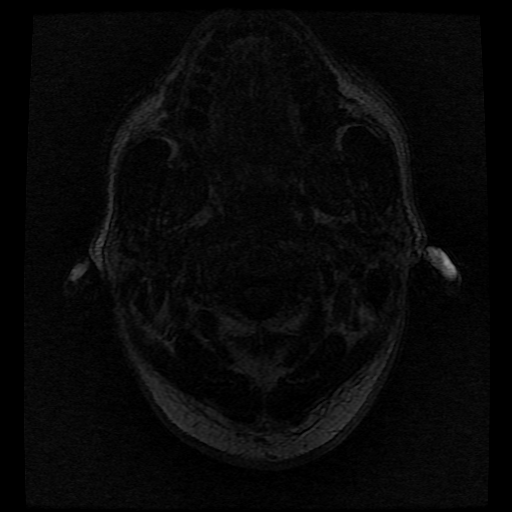
[im 12/108]
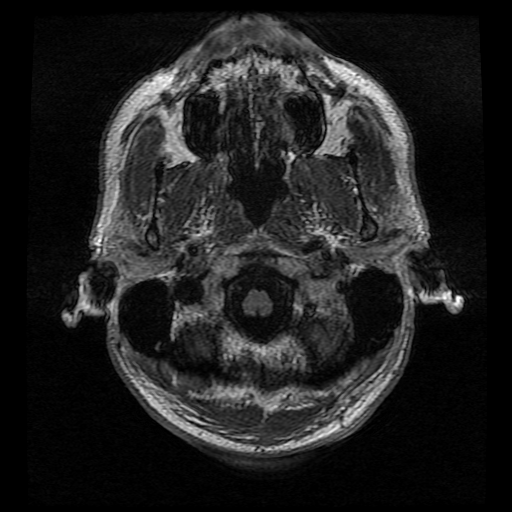

[Series 10: T2 · coronal · 5.0mm · 0.41mm/px · 3 of 30 slices shown (2 of 2)]
[im 1/30]
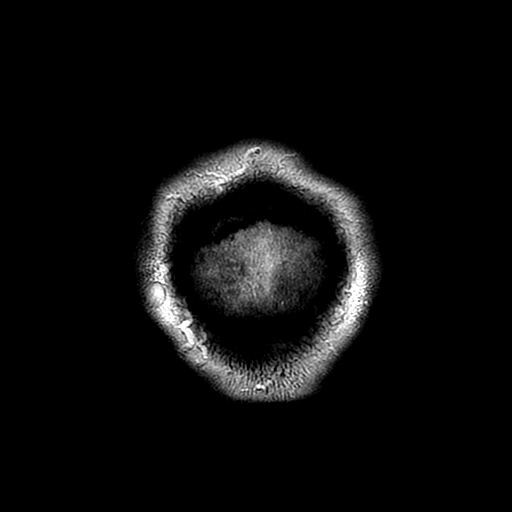
[im 15/30]
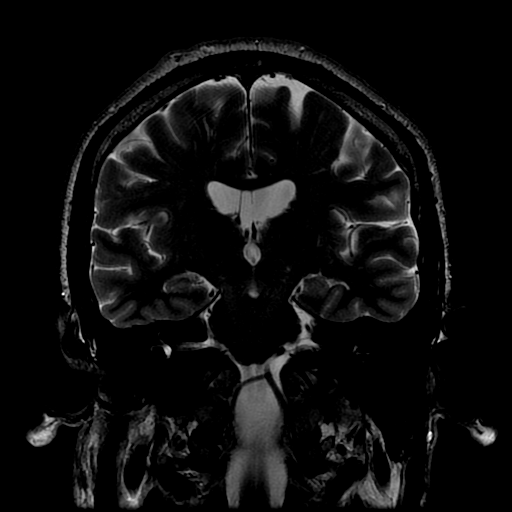
[im 30/30]
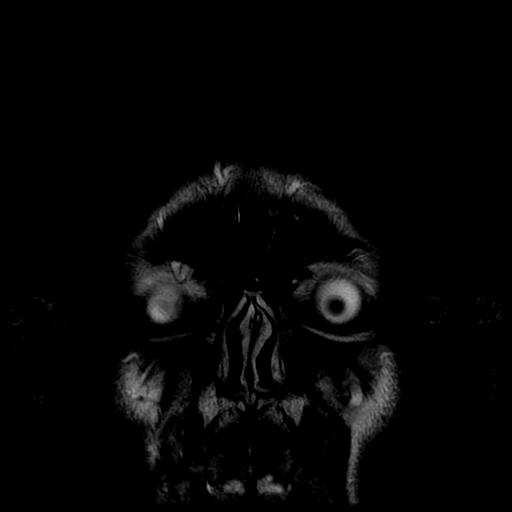

[Series 300: DWI · axial · 3.0mm · 1.09mm/px · z∈[-61,+98]mm · 5 of 54 slices shown (3 of 4)]
[im 1/54]
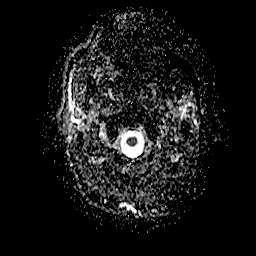
[im 14/54]
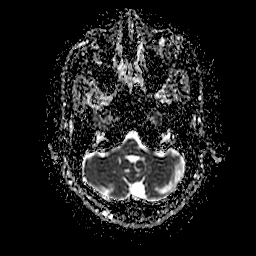
[im 27/54]
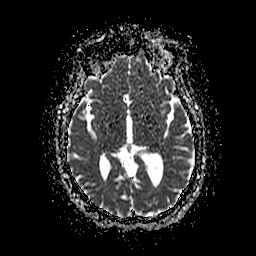
[im 40/54]
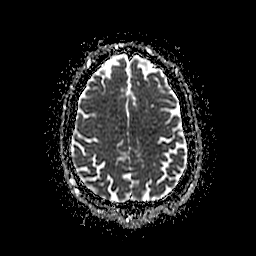
[im 54/54]
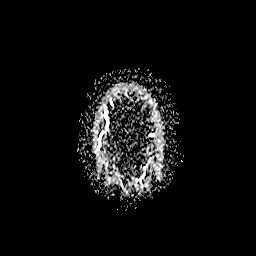

[Series 400: DWI · coronal · 5.0mm · 1.09mm/px · 4 of 40 slices shown (4 of 4)]
[im 1/40]
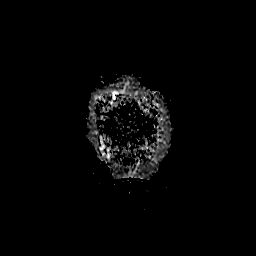
[im 14/40]
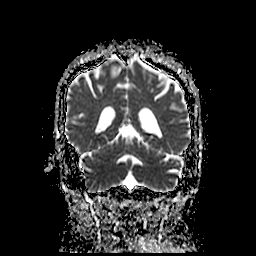
[im 27/40]
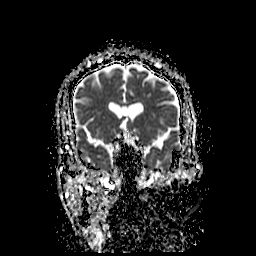
[im 40/40]
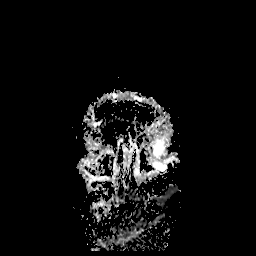

[38 of 48 positions shown; findings below may reference images not displayed]

FINDINGS: Brain: Multiple small acute infarcts in the posterior right frontal
lobe, predominantly cortical. Mild associated edema without mass
effect. Additional potential acute punctate infarct in the
anterolateral left temporal lobe versus artifact (series 3, image
22). No acute hemorrhage, hydrocephalus, mass lesion, midline shift,
or extra-axial fluid collection. Mildly prominent retro cerebellar
CSF, potentially mega cisterna magna versus arachnoid cyst without
significant mass effect.

Vascular: See same day MRA.

Skull and upper cervical spine: Normal marrow signal.

Sinuses/Orbits: Mild ethmoid air cell mucosal thickening.
Unremarkable orbits.

Other: No sizable mastoid effusions.
IMPRESSION: Multiple small acute infarcts in the posterior right frontal lobe,
predominantly cortical. Additional potential acute punctate infarct
in the anterolateral left temporal lobe versus artifact. Given
potential involvement of multiple vascular territories, consider a
central embolic etiology.

## 2021-09-06 IMAGING — DX DG CHEST 1V
1 series · 1 of 1 positions shown · non-contrast
Comparison: None.

CLINICAL DATA: Left-sided body weakness

EXAM:
CHEST  1 VIEW

[chest ap]
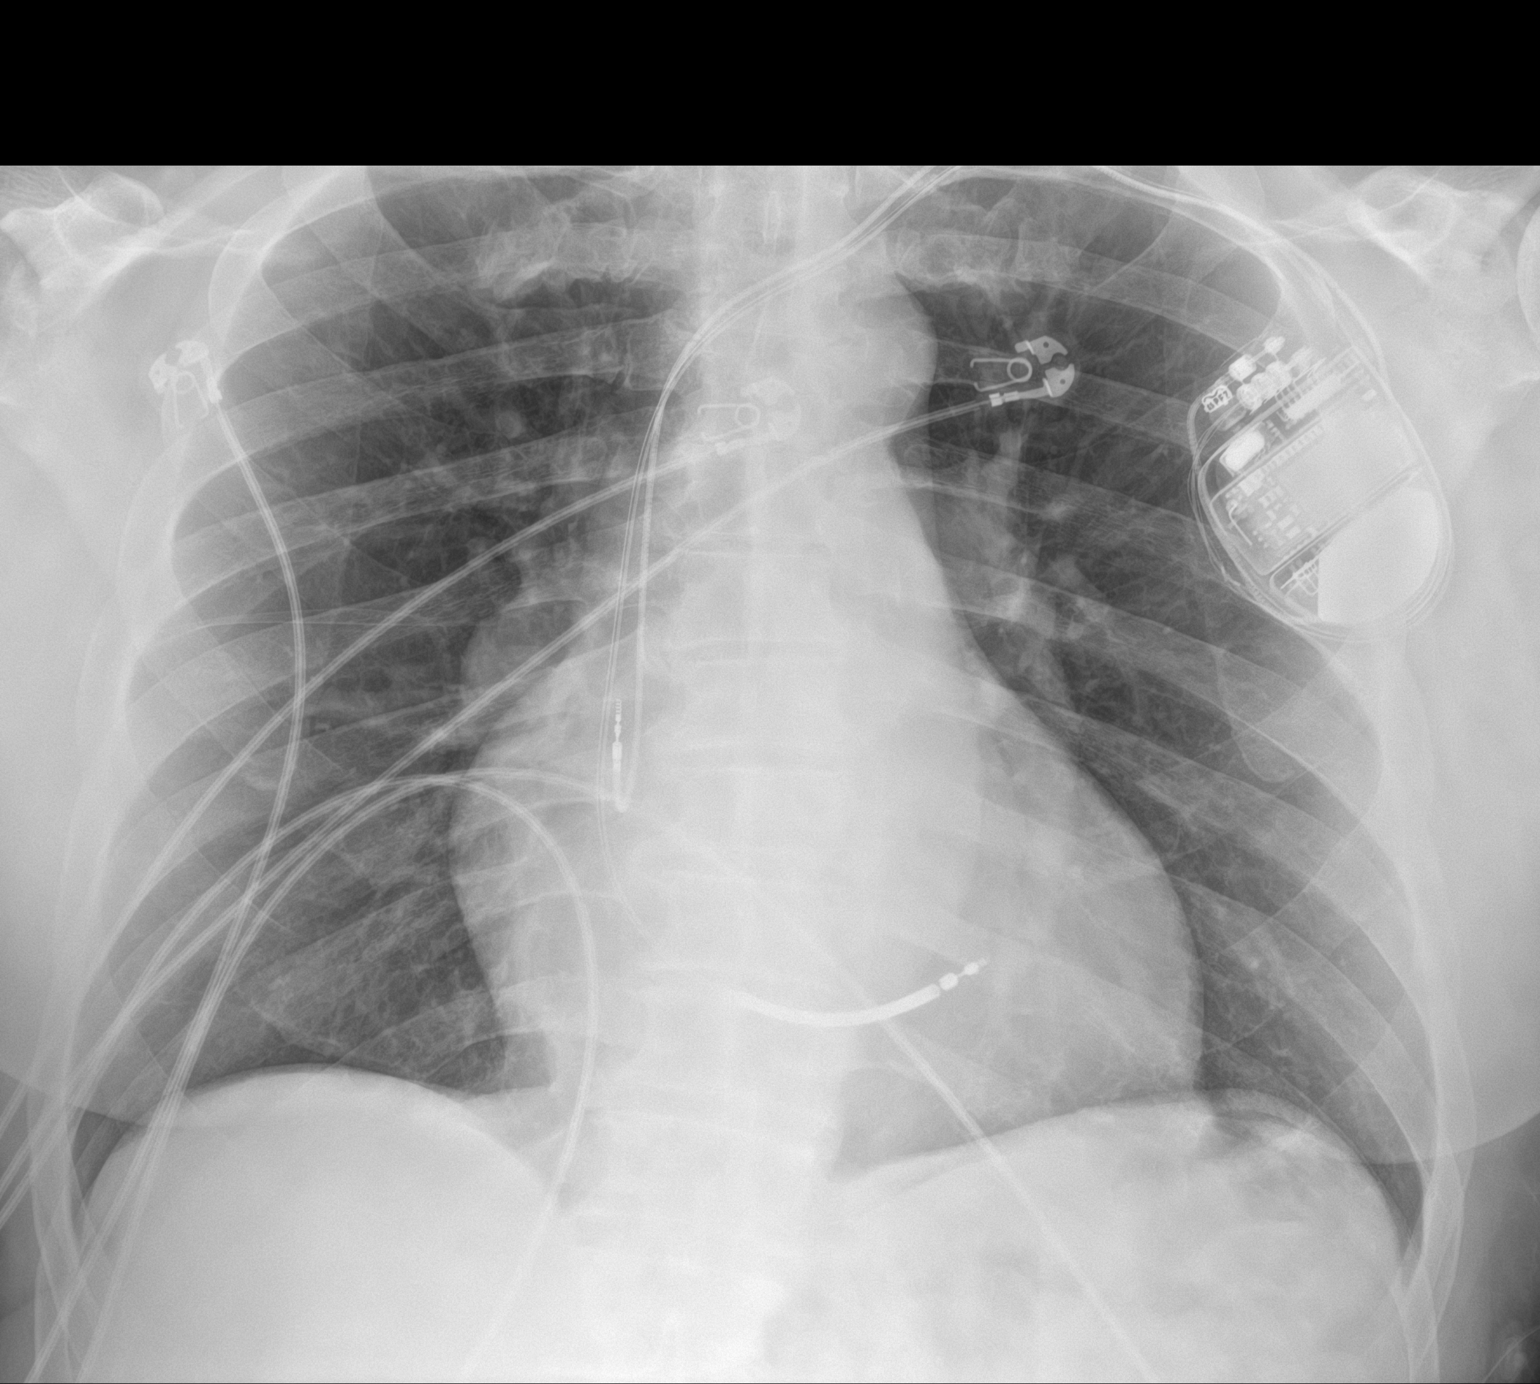

[1 of 1 positions shown; findings below may reference images not displayed]

FINDINGS: Left-sided implanted cardiac device. Heart size is upper limits of
normal. No focal airspace consolidation, pleural effusion, or
pneumothorax.
IMPRESSION: No active disease.

## 2021-09-06 IMAGING — MR MR MRA NECK W/O CM
2 series · 19 of 48 positions shown · non-contrast
Comparison: No pertinent prior exam.

CLINICAL DATA: Neuro deficit, acute, stroke suspected

EXAM:
MRA NECK WITHOUT CONTRAST
TECHNIQUE: Angiographic images of the neck were acquired using MRA technique
without intravenous contrast. Carotid stenosis measurements (when
applicable) are obtained utilizing NASCET criteria, using the distal
internal carotid diameter as the denominator.

[Series 3: ax (id) · axial · 2.8mm · 0.49mm/px · z∈[-74,+166]mm · 18 of 180 slices shown]
[im 1/180]
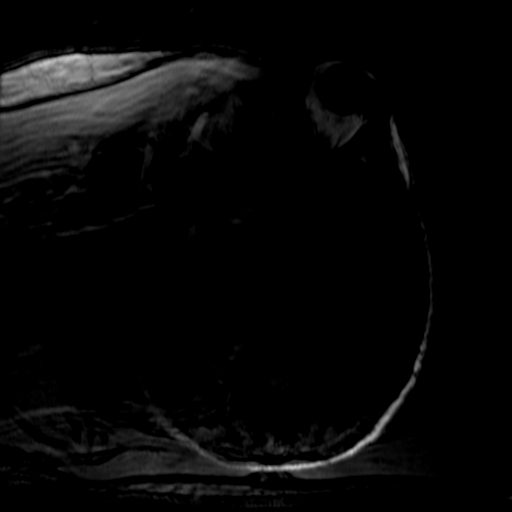
[im 4/180]
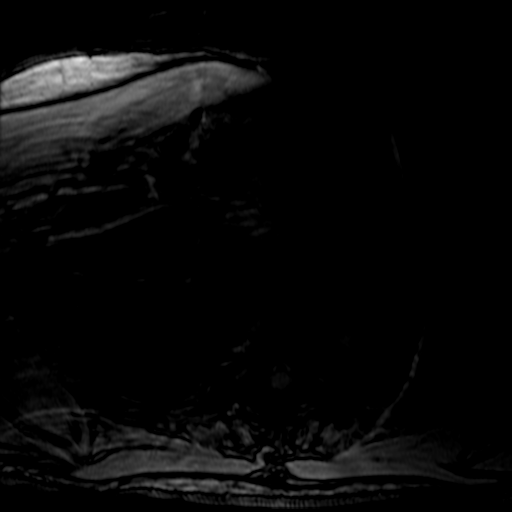
[im 8/180]
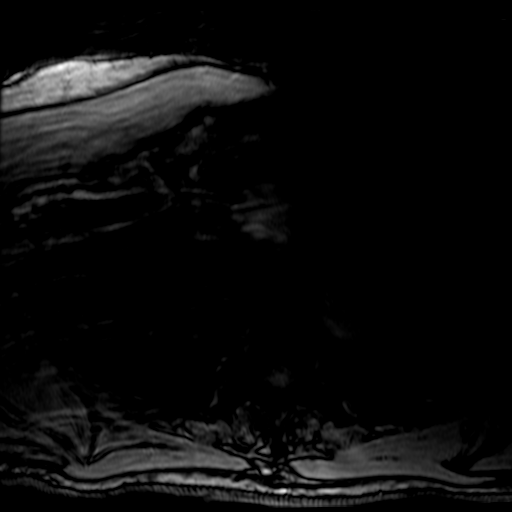
[im 12/180]
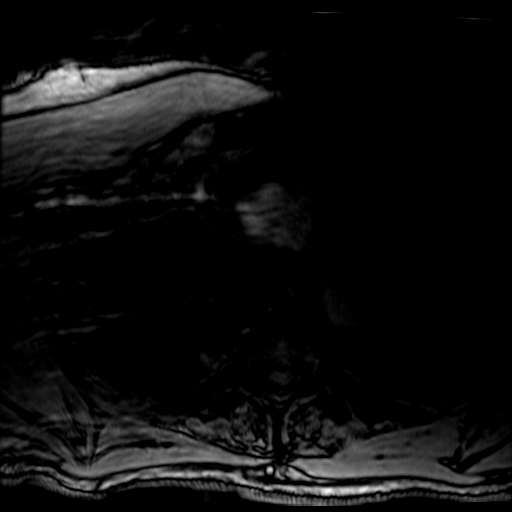
[im 16/180]
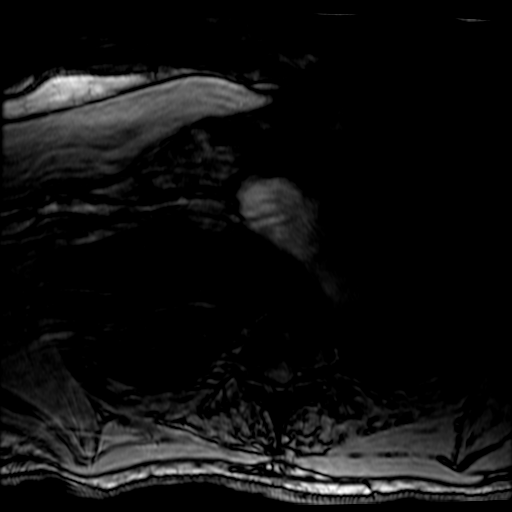
[im 20/180]
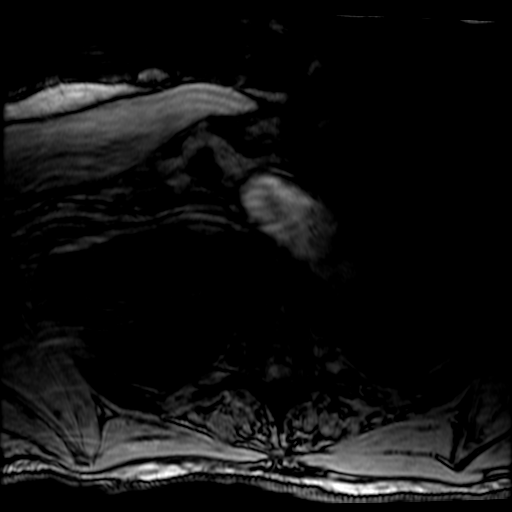
[im 24/180]
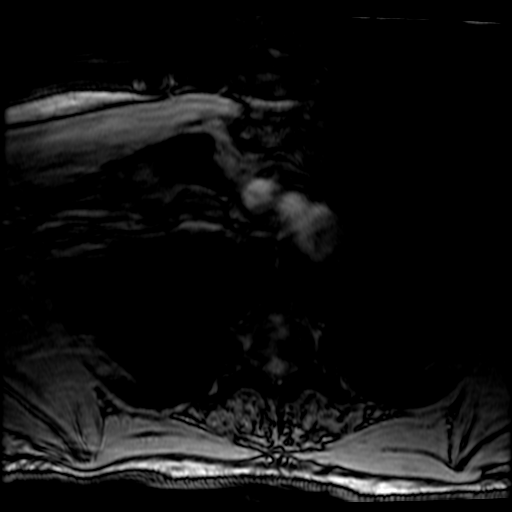
[im 28/180]
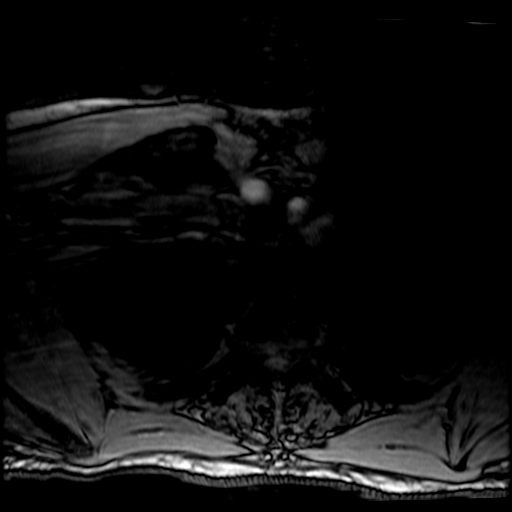
[im 32/180]
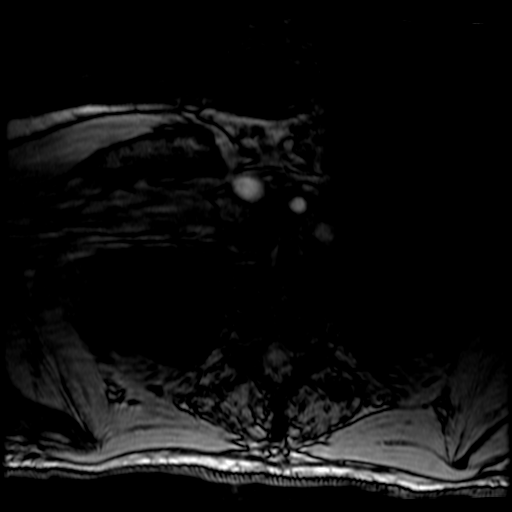
[im 36/180]
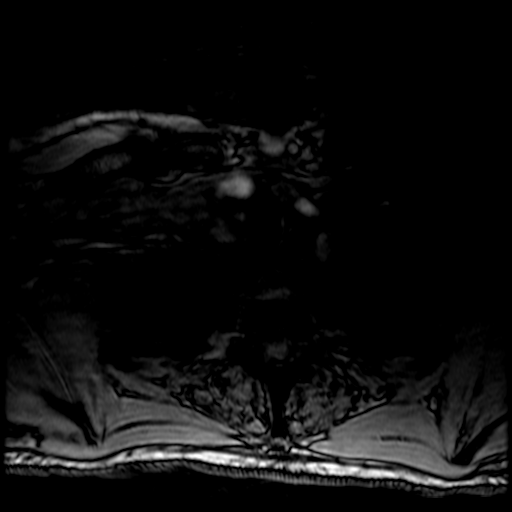
[im 55/180]
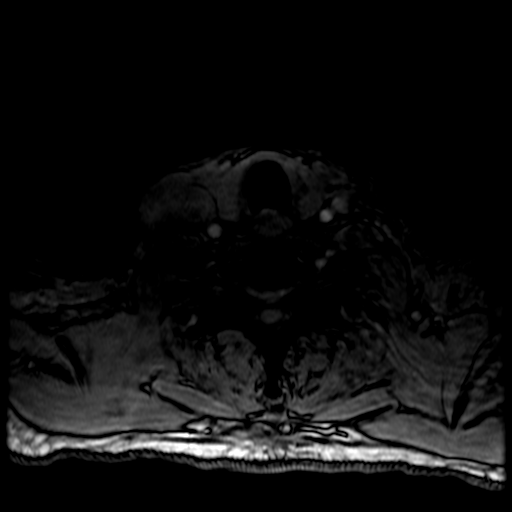
[im 78/180]
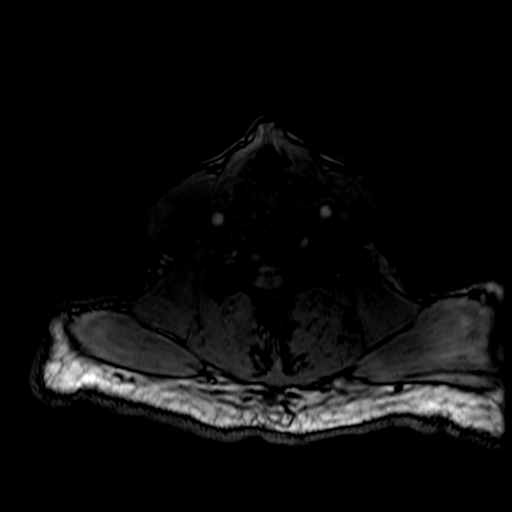
[im 90/180]
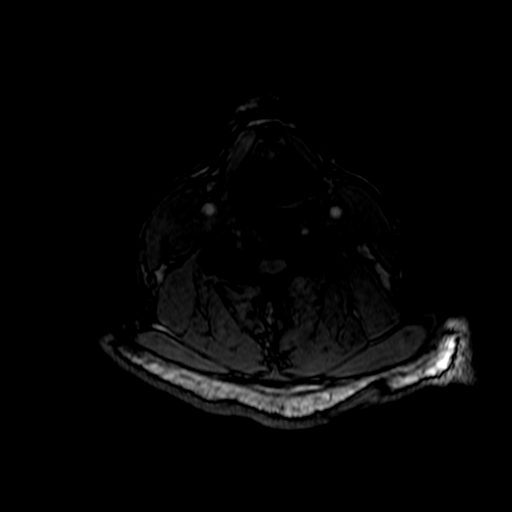
[im 102/180]
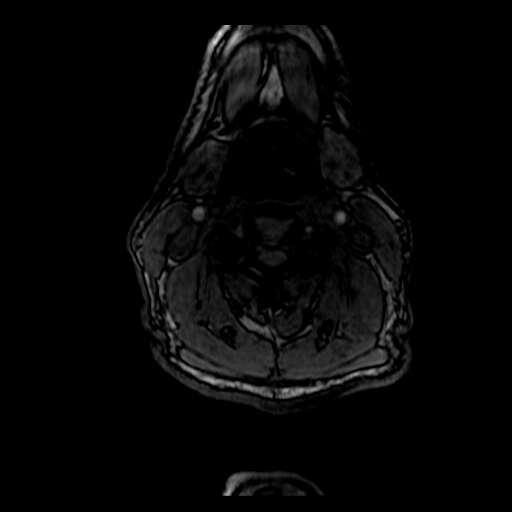
[im 125/180]
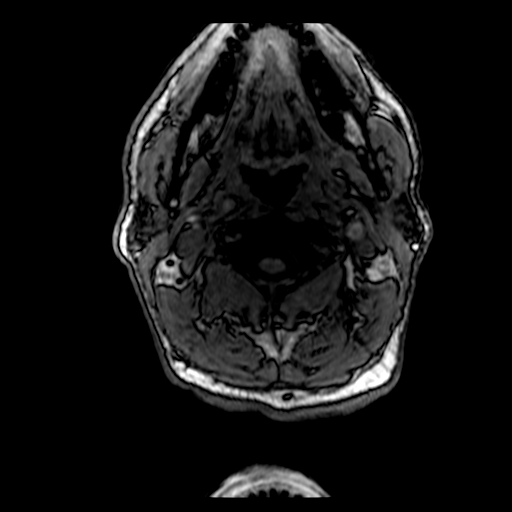
[im 148/180]
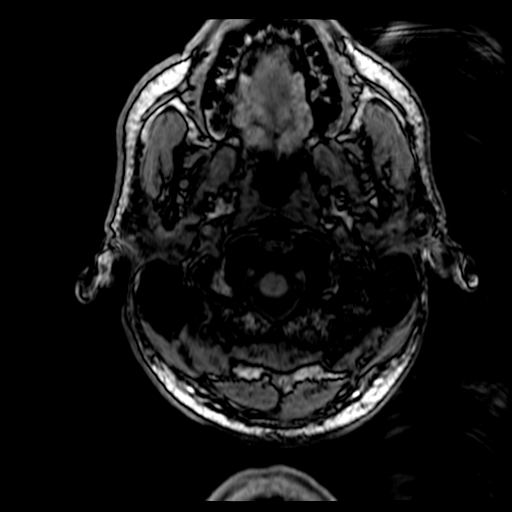
[im 152/180]
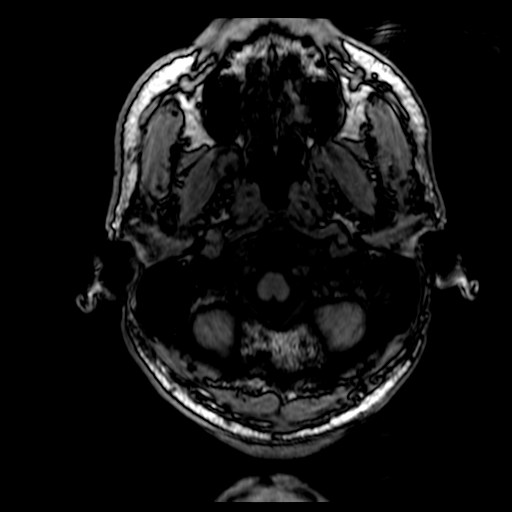
[im 172/180]
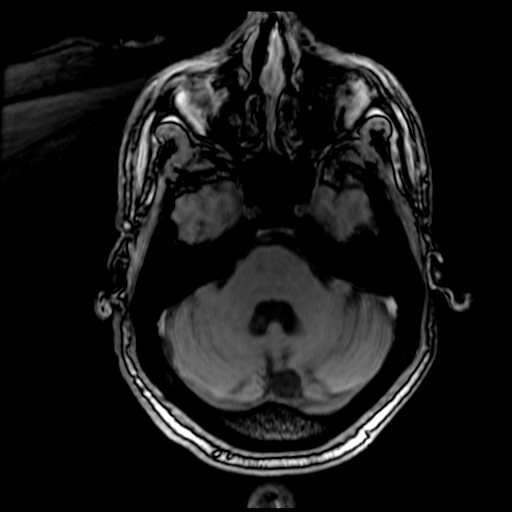

[Series 304: rotate · coronal · 2.8mm · 0.50mm/px · 1 of 4 slices shown]
[im 1/4]
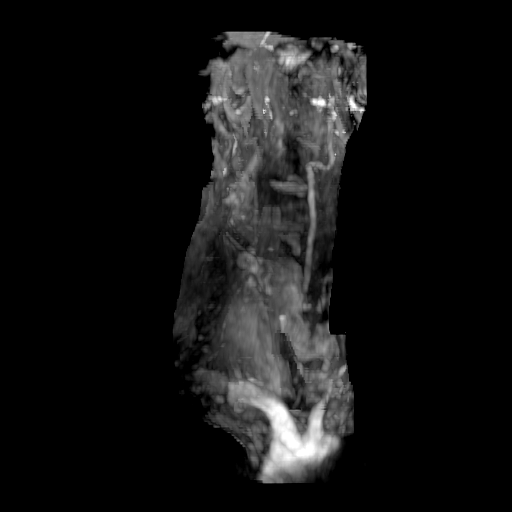

[19 of 48 positions shown; findings below may reference images not displayed]

FINDINGS: Artifact from patient's pacemaker significantly limits evaluation.
No visible high-grade stenosis of either common carotid arteries or
the dominant left vertebral artery. Artifact precludes assessment
for stenosis of the internal carotid arteries or non dominant right
vertebral artery.
IMPRESSION: Significant artifact from the patient's pacemaker limits evaluation.
No visible high-grade stenosis of either common carotid arteries or
the dominant left vertebral artery. Artifact precludes assessment
for stenosis of the internal carotid arteries or nondominant right
vertebral artery. A CTA could better evaluate if clinically
indicated.

## 2021-09-06 IMAGING — MR MR MRA HEAD W/O CM
2 series · 16 of 16 positions shown · non-contrast
Comparison: No pertinent prior exam.

CLINICAL DATA: Neuro deficit, acute, stroke suspected

EXAM:
MRA HEAD WITHOUT CONTRAST
TECHNIQUE: Angiographic images of the Circle of Willis were acquired using MRA
technique without intravenous contrast.

[Series 3: (id) mt fs · axial · 1.4mm · 0.43mm/px · z∈[-62,+44]mm · 15 of 152 slices shown]
[im 1/152]
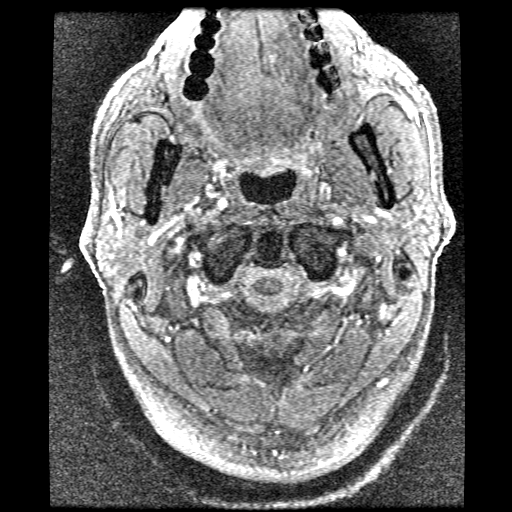
[im 11/152]
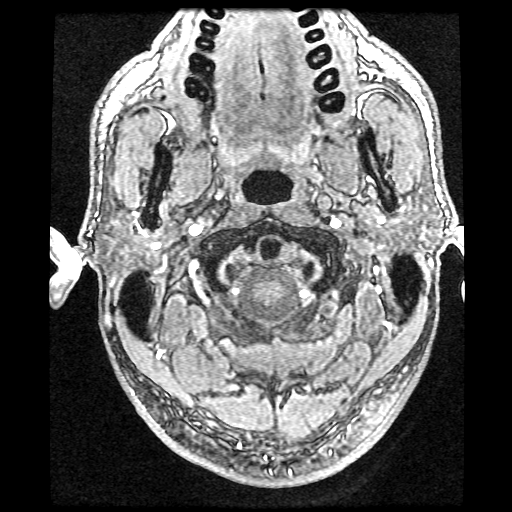
[im 22/152]
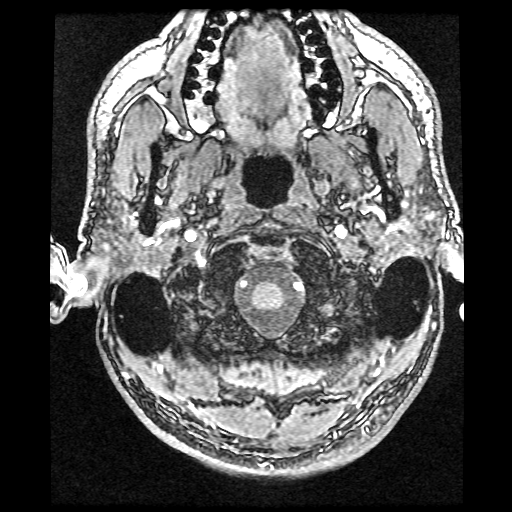
[im 33/152]
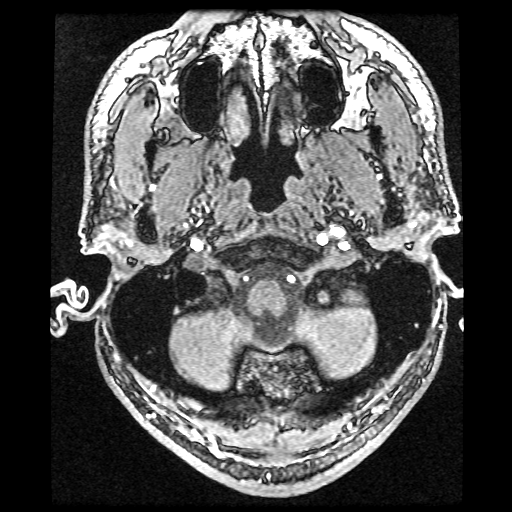
[im 44/152]
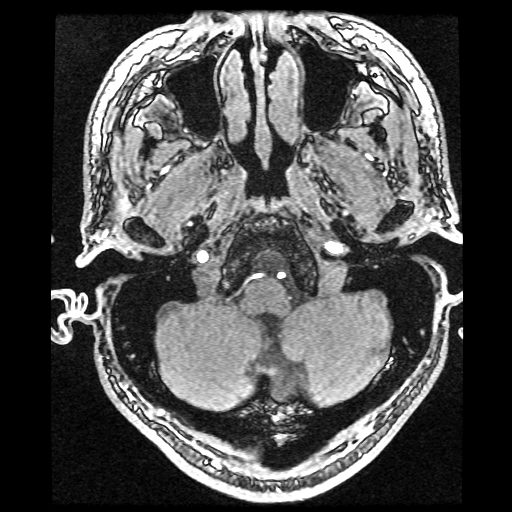
[im 54/152]
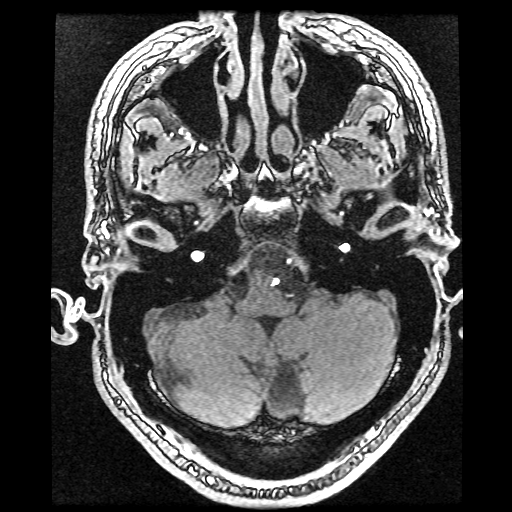
[im 65/152]
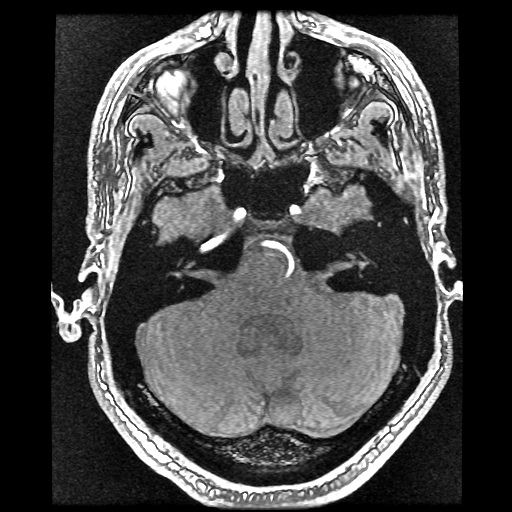
[im 76/152]
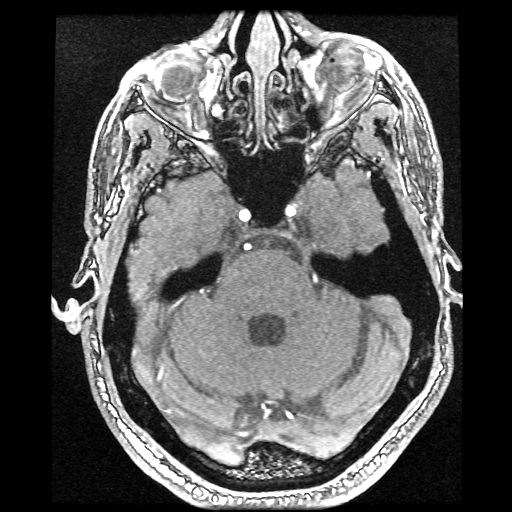
[im 87/152]
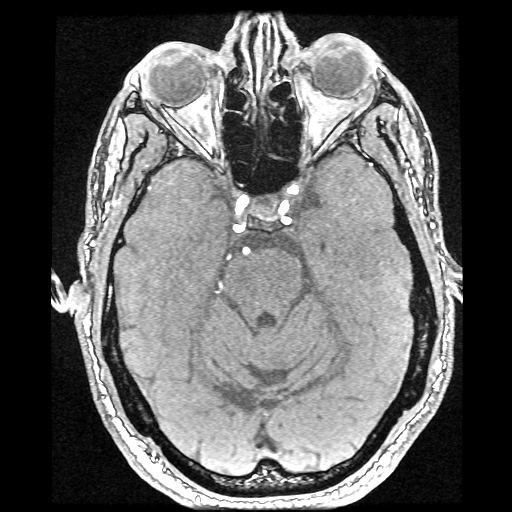
[im 98/152]
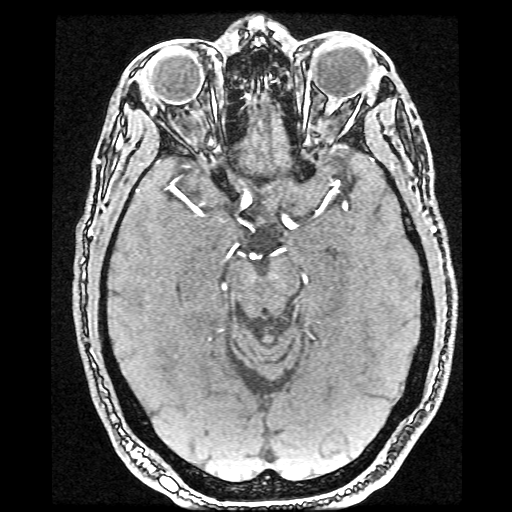
[im 108/152]
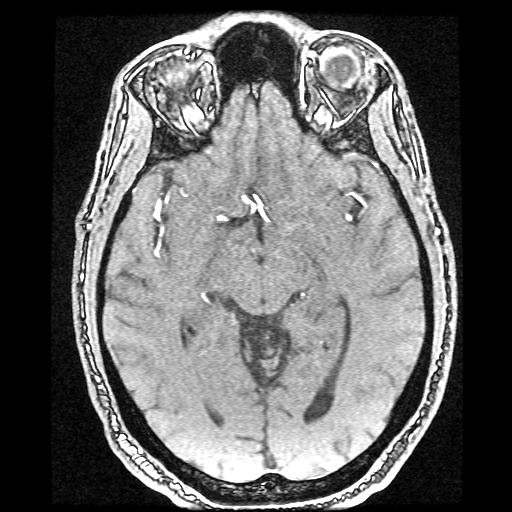
[im 119/152]
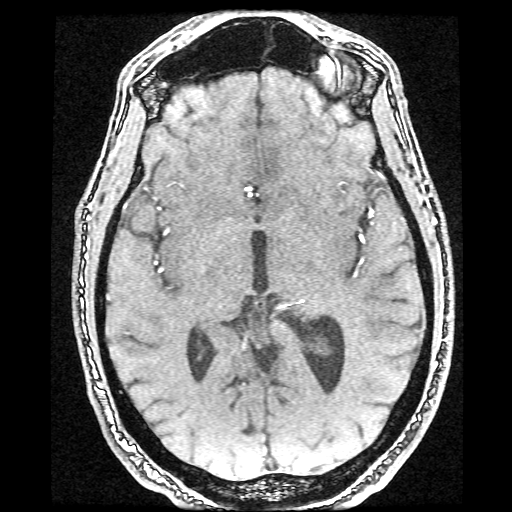
[im 130/152]
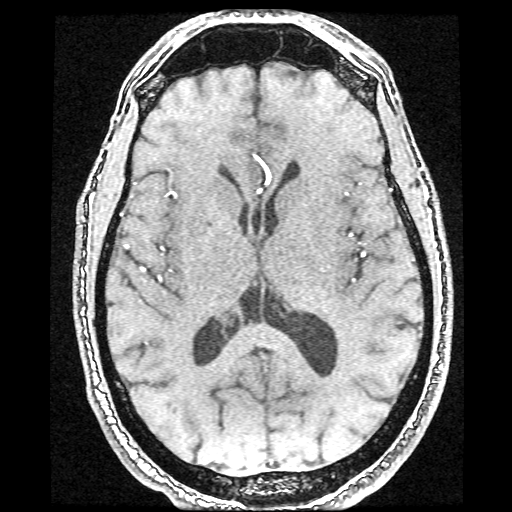
[im 141/152]
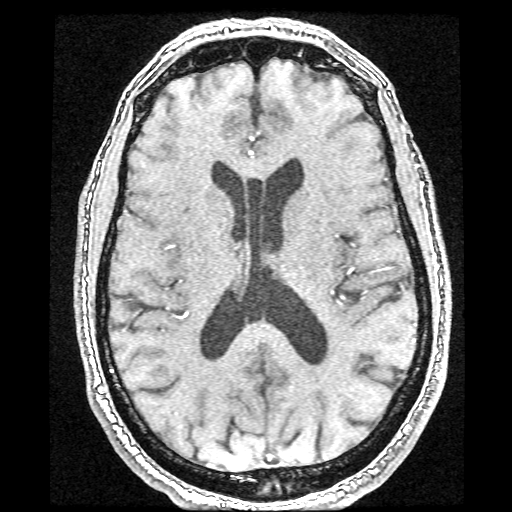
[im 152/152]
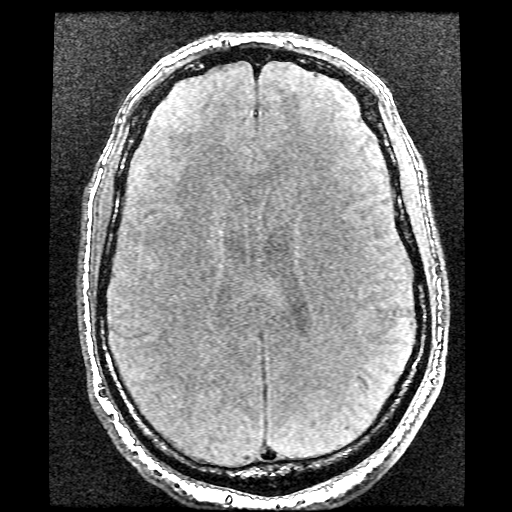

[Series 300: col:(id) mt fs · axial · 1.4mm · 0.43mm/px · 1 of 1 slices shown]
[im 1/1]
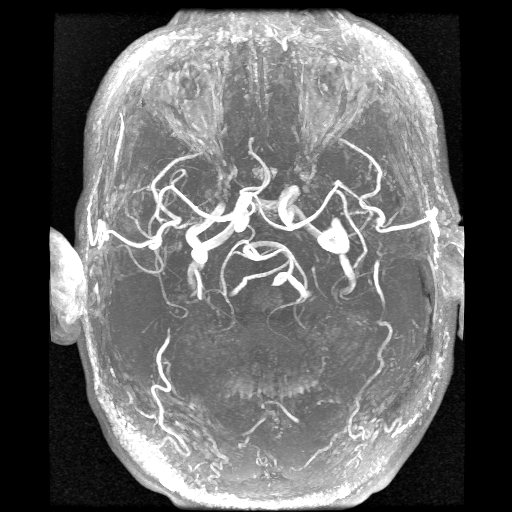

[16 of 16 positions shown; findings below may reference images not displayed]

FINDINGS: Anterior circulation: Bilateral intracranial ICAs, MCAs, and ACAs
are patent without proximal hemodynamically significant stenosis.

Posterior circulation: Bilateral intradural vertebral arteries,
basilar artery, and bilateral posterior cerebral arteries are patent
without proximal hemodynamically significant stenosis. Right
posterior communicating artery.
IMPRESSION: No large vessel occlusion or proximal hemodynamically significant
stenosis.

## 2021-09-06 MED ORDER — ATORVASTATIN CALCIUM 40 MG PO TABS
40.0000 mg | ORAL_TABLET | Freq: Every day | ORAL | 2 refills | Status: AC
  Filled 2021-09-06: qty 30, 30d supply, fill #0

## 2021-09-06 NOTE — Progress Notes (Signed)
Changed device settings for MRI to  ODO  MRI mode/Tachy-therapies to off  Will Program device back to pre-MRI settings after completion of exam, and send transmission. 

## 2021-09-06 NOTE — Discharge Instructions (Signed)
Information on my medicine - XARELTO (rivaroxaban)  This medication education was reviewed with me or my healthcare representative as part of my discharge preparation.  The pharmacist that spoke with me during my hospital stay was:    WHY WAS XARELTO PRESCRIBED FOR YOU? Xarelto was prescribed to treat blood clots that may have been found in the veins of your legs (deep vein thrombosis) or in your lungs (pulmonary embolism) and to reduce the risk of them occurring again.  What do you need to know about Xarelto? Continue Xarelto 20 mg tablet taken ONCE A DAY with your evening meal.  DO NOT stop taking Xarelto without talking to the health care provider who prescribed the medication.  Refill your prescription for 20 mg tablets before you run out.  After discharge, you should have regular check-up appointments with your healthcare provider that is prescribing your Xarelto.  In the future your dose may need to be changed if your kidney function changes by a significant amount.  What do you do if you miss a dose? If you are taking Xarelto TWICE DAILY and you miss a dose, take it as soon as you remember. You may take two 15 mg tablets (total 30 mg) at the same time then resume your regularly scheduled 15 mg twice daily the next day.  If you are taking Xarelto ONCE DAILY and you miss a dose, take it as soon as you remember on the same day then continue your regularly scheduled once daily regimen the next day. Do not take two doses of Xarelto at the same time.   Important Safety Information Xarelto is a blood thinner medicine that can cause bleeding. You should call your healthcare provider right away if you experience any of the following: Bleeding from an injury or your nose that does not stop. Unusual colored urine (red or dark brown) or unusual colored stools (red or black). Unusual bruising for unknown reasons. A serious fall or if you hit your head (even if there is no  bleeding).  Some medicines may interact with Xarelto and might increase your risk of bleeding while on Xarelto. To help avoid this, consult your healthcare provider or pharmacist prior to using any new prescription or non-prescription medications, including herbals, vitamins, non-steroidal anti-inflammatory drugs (NSAIDs) and supplements.  This website has more information on Xarelto: www.xarelto.com.  

## 2021-09-06 NOTE — Discharge Summary (Signed)
Physician Discharge Summary  Bobby Griffin YJE:[id number] DOB: October 03, 1956 DOA: 09/02/2021  PCP: Oneita Hurt, No  Admit date: 09/02/2021 Discharge date: 09/06/2021  Admitted From: Home Disposition:  Home (with sister and ultimately back to his home in [place])   Recommendations for Outpatient Follow-up:  Follow up with PCP (Military MD through [facility]) Follow up with Cardiology   Discharge Condition: Stable CODE STATUS: Full  Diet recommendation: Heart healthy   Brief/Interim Summary: Bobby Griffin is a 65 y.o. male with a PMH significant for nonischemic cardiomyopathy status post ICD placement in 2017, PAF, DVTs, HTN, HLD, noninsulin-dependent T2 DM, BPH. They presented from home to the ED on 09/02/2021 with facial droop and abnormal hand sensation/strength on left starting acutely today. In the ED, it was found that they had TIA. They were treated with anticoagulation, permissive hypertension, PT/OT, neurology consult. Patient was admitted to medicine service for further workup and management of TIA as outlined in detail below. Hospitalization was prolonged due to pending MRI. Due to his ICD, MRI unit required a tech for reprogramming.   Discharge Diagnoses:  Principal Problem:   TIA (transient ischemic attack) Active Problems:   Chronic systolic CHF (congestive heart failure) (HCC)   AF (paroxysmal atrial fibrillation) (HCC)   Essential hypertension   Hyperlipidemia   CVA -Appreciate neurology -MRI brain Multiple small acute infarcts in the posterior right frontal lobe, predominantly cortical. Additional potential acute punctate infarct in the anterolateral left temporal lobe versus artifact. Given potential involvement of multiple vascular territories, consider a central embolic etiology. -MRA head without large vessel occlusion or proximal hemodynamically significant stenosis -MRA neck significant artifact from the patient's pacemaker limits evaluation. No visible high-grade  stenosis of either common carotid arteries or the dominant left vertebral artery. Artifact precludes assessment for stenosis of the internal carotid arteries or nondominant right vertebral artery. A CTA could better evaluate if clinically indicated. -PT OT SLP recommended no further follow-up   Chest pain -Likely GI related -Resolved   HFrEF -Last echo 04/2019 showed ejection fraction 30 to 35%.  ICD implantation in 2017. Home meds include Entresto, carvedilol, eplerenone -Repeat echocardiogram showed EF 40 to 45%, grade 1 diastolic dysfunction -Follow-up with cardiology as outpatient  Vtach -S/p ablation    PAF -Continue Xarelto   HTN -Continue holding home meds to allow permissive hypertension   Type 2 diabetes -A1c 5.2.  Well controlled   Hyperlipidemia -Lipitor  Discharge Instructions  Discharge Instructions     Call MD for:  difficulty breathing, headache or visual disturbances   Complete by: As directed    Call MD for:  extreme fatigue   Complete by: As directed    Call MD for:  persistant dizziness or light-headedness   Complete by: As directed    Call MD for:  persistant nausea and vomiting   Complete by: As directed    Call MD for:  severe uncontrolled pain   Complete by: As directed    Call MD for:  temperature >100.4   Complete by: As directed    Diet - low sodium heart healthy   Complete by: As directed    Discharge instructions   Complete by: As directed    You were cared for by a hospitalist during your hospital stay. If you have any questions about your discharge medications or the care you received while you were in the hospital after you are discharged, you can call the unit and ask to speak with the hospitalist on call if the hospitalist that  took care of you is not available. Once you are discharged, your primary care physician will handle any further medical issues. Please note that NO REFILLS for any discharge medications will be authorized once you  are discharged, as it is imperative that you return to your primary care physician (or establish a relationship with a primary care physician if you do not have one) for your aftercare needs so that they can reassess your need for medications and monitor your lab values.   Increase activity slowly   Complete by: As directed       Allergies as of 09/06/2021       Reactions   Penicillins         Medication List     STOP taking these medications    pravastatin 20 MG tablet Commonly known as: PRAVACHOL       TAKE these medications    atorvastatin 40 MG tablet Commonly known as: LIPITOR Take 1 tablet (40 mg total) by mouth daily. Start taking on: September 07, 2021   carvedilol 25 MG tablet Commonly known as: COREG Take 12.5 mg by mouth in the morning and at bedtime.   diclofenac Sodium 1 % Gel Commonly known as: VOLTAREN Apply 2-4 g topically every 4 (four) hours as needed (pain).   empagliflozin 10 MG Tabs tablet Commonly known as: JARDIANCE Take 10 mg by mouth in the morning.   Entresto 24-26 MG Generic drug: sacubitril-valsartan Take 1 tablet by mouth 2 (two) times daily.   eplerenone 25 MG tablet Commonly known as: INSPRA Take 12.5 mg by mouth daily.   glucosamine-chondroitin 500-400 MG tablet Take 2 tablets by mouth every evening.   multivitamin with minerals tablet Take 1 tablet by mouth daily.   OCEAN NASAL SPRAY NA Place 1 spray into both nostrils daily as needed (congestion).   rivaroxaban 20 MG Tabs tablet Commonly known as: XARELTO Take 20 mg by mouth every evening.   sildenafil 25 MG tablet Commonly known as: VIAGRA Take 25 mg by mouth daily as needed for erectile dysfunction.   tamsulosin 0.4 MG Caps capsule Commonly known as: FLOMAX Take 1 capsule by mouth every evening.        Follow-up Information     Follow up with your PCP in [place] Follow up.   Why: You will need Neurology referral for outpatient follow up        Dr. Albertine Grates, Cardiology Follow up.                 Allergies  Allergen Reactions   Penicillins     Consultations: Neurology    Procedures/Studies: DG Chest 1 View  Result Date: 09/06/2021 CLINICAL DATA:  Left-sided body weakness EXAM: CHEST  1 VIEW COMPARISON:  None. FINDINGS: Left-sided implanted cardiac device. Heart size is upper limits of normal. No focal airspace consolidation, pleural effusion, or pneumothorax. IMPRESSION: No active disease. Electronically Signed   By: Duanne Guess D.O.   On: 09/06/2021 09:06   CT HEAD WO CONTRAST ( )  Result Date: 09/04/2021 CLINICAL DATA:  65 year old male with neurologic deficit. Left side weakness. EXAM: CT HEAD WITHOUT CONTRAST TECHNIQUE: Contiguous axial images were obtained from the base of the skull through the vertex without intravenous contrast. COMPARISON:  Head CT 09/02/2021. FINDINGS: Brain: Cavum septum pellucidum, normal variant. Cerebral volume is within normal limits for age. No midline shift, mass effect, or evidence of intracranial mass lesion. No ventriculomegaly. No acute intracranial hemorrhage identified. Supratentorial gray-white matter differentiation appears  stable and within normal limits. But heterogeneous hypodensity in the pons is difficult to exclude (series 3, image 12), although seems more pronounced on the left (would cause right side symptoms). No acute cortically based cerebral or cerebellar infarct identified. Vascular: Calcified atherosclerosis at the skull base. No suspicious intracranial vascular hyperdensity. Skull: Stable, negative. Sinuses/Orbits: Visualized paranasal sinuses and mastoids are stable and well aerated. Other: No acute orbit or scalp soft tissue finding. IMPRESSION: 1. Difficult to exclude age indeterminate small vessel ischemia in the pons, Brain MRI would best evaluate further if symptoms persist. 2. Otherwise negative for age noncontrast CT appearance of the brain. Electronically Signed    By: Odessa Fleming M.D.   On: 09/04/2021 08:59   CT HEAD WO CONTRAST  Result Date: 09/02/2021 CLINICAL DATA:  Neuro deficit, acute, stroke suspected Left-sided body weakness.  Confusion and dizziness. EXAM: CT HEAD WITHOUT CONTRAST TECHNIQUE: Contiguous axial images were obtained from the base of the skull through the vertex without intravenous contrast. COMPARISON:  None. FINDINGS: Brain: No intracranial hemorrhage, mass effect, or midline shift. No hydrocephalus. Incidental cavum septum pellucidum, normal variant. The basilar cisterns are patent. No evidence of territorial infarct or acute ischemia. No extra-axial or intracranial fluid collection. Vascular: No hyperdense vessel or unexpected calcification. Skull: Normal. Negative for fracture or focal lesion. Sinuses/Orbits: Paranasal sinuses and mastoid air cells are clear. The visualized orbits are unremarkable. Other: None. IMPRESSION: Negative noncontrast head CT. Electronically Signed   By: Narda Rutherford M.D.   On: 09/02/2021 22:26   MR ANGIO HEAD WO CONTRAST  Result Date: 09/06/2021 CLINICAL DATA:  Neuro deficit, acute, stroke suspected EXAM: MRA HEAD WITHOUT CONTRAST TECHNIQUE: Angiographic images of the Circle of Willis were acquired using MRA technique without intravenous contrast. COMPARISON:  No pertinent prior exam. FINDINGS: Anterior circulation: Bilateral intracranial ICAs, MCAs, and ACAs are patent without proximal hemodynamically significant stenosis. Posterior circulation: Bilateral intradural vertebral arteries, basilar artery, and bilateral posterior cerebral arteries are patent without proximal hemodynamically significant stenosis. Right posterior communicating artery. IMPRESSION: No large vessel occlusion or proximal hemodynamically significant stenosis. Electronically Signed   By: Feliberto Harts M.D.   On: 09/06/2021 15:27   MR ANGIO NECK WO CONTRAST  Result Date: 09/06/2021 CLINICAL DATA:  Neuro deficit, acute, stroke suspected  EXAM: MRA NECK WITHOUT CONTRAST TECHNIQUE: Angiographic images of the neck were acquired using MRA technique without intravenous contrast. Carotid stenosis measurements (when applicable) are obtained utilizing NASCET criteria, using the distal internal carotid diameter as the denominator. COMPARISON:  No pertinent prior exam. 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 No visible high-grade stenosis of either common carotid arteries or the dominant left vertebral artery. Artifact precludes assessment for stenosis of the internal carotid arteries or non dominant right vertebral artery. 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 No visible high-grade stenosis of either common carotid arteries or the dominant left vertebral artery. Artifact precludes assessment for stenosis of the internal carotid arteries or nondominant right vertebral artery. A CTA could better evaluate if clinically indicated. Electronically Signed   By: Feliberto Harts M.D.   On: 09/06/2021 16:18   MR BRAIN WO CONTRAST  Result Date: 09/06/2021 CLINICAL DATA:  Facial trauma EXAM: MRI HEAD WITHOUT CONTRAST TECHNIQUE: Multiplanar, multiecho pulse sequences of the brain and surrounding structures were obtained without intravenous contrast. COMPARISON:  CT head September 04, 2021. FINDINGS: Brain: Multiple small acute infarcts in the posterior right frontal lobe, predominantly cortical. Mild associated edema without mass effect. Additional potential  acute punctate infarct in the anterolateral left temporal lobe versus artifact (series 3, image 22). No acute hemorrhage, hydrocephalus, mass lesion, midline shift, or extra-axial fluid collection. Mildly prominent retro cerebellar CSF, potentially mega cisterna magna versus arachnoid cyst without significant mass effect. Vascular: See same day MRA. Skull and upper cervical spine: Normal marrow signal. Sinuses/Orbits: Mild ethmoid  air cell mucosal thickening. Unremarkable orbits. Other: No sizable mastoid effusions. IMPRESSION: Multiple small acute infarcts in the posterior right frontal lobe, predominantly cortical. Additional potential acute punctate infarct in the anterolateral left temporal lobe versus artifact. Given potential involvement of multiple vascular territories, consider a central embolic etiology. Electronically Signed   By: Feliberto Harts M.D.   On: 09/06/2021 15:41   VAS [place] TRANSCRANIAL DOPPLER W BUBBLES  Result Date: 09/04/2021  Transcranial Doppler with Bubble Patient Name:  Bobby Griffin  Date of Exam:   09/03/2021 Medical Rec #: [id number]     Accession #:    [id number] Date of Birth: 12/29/1955     Patient Gender: M Patient Age:   82 years Exam Location:  [facility] Procedure:      VAS [place] TRANSCRANIAL DOPPLER W BUBBLES Referring Phys: Gevena Mart --------------------------------------------------------------------------------  Indications: Stroke. Performing Technologist: [place] Ponder RVS  Examination Guidelines: A complete evaluation includes B-mode imaging, spectral Doppler, color Doppler, and power Doppler as needed of all accessible portions of each vessel. Bilateral testing is considered an integral part of a complete examination. Limited examinations for reoccurring indications may be performed as noted.  Summary: No HITS at rest or during Valsalva. Negative transcranial Doppler Bubble study with no evidence of right to left intracardiac communication.  A vascular evaluation was performed. The right middle cerebral artery was studied. An IV was inserted into the patient's left forearm . Verbal informed consent was obtained.  NEGATIVE TCD Bubble study with no evidence of right toleft shunt noted. *See table(s) above for TCD measurements and observations.  Diagnosing physician: Delia Heady MD Electronically signed by Delia Heady MD on 09/04/2021 at 3:51:44 PM.    Final    ECHOCARDIOGRAM  COMPLETE  Result Date: 09/03/2021    ECHOCARDIOGRAM REPORT   Patient Name:   Bobby Griffin Date of Exam: 09/03/2021 Medical Rec #:  [id number]    Height:       73.0 in Accession #:    [id number]   Weight:       248.0 lb Date of Birth:  22-Mar-1956    BSA:          2.358 m Patient Age:    64 years     BP:           98/71 mmHg Patient Gender: M            HR:           78 bpm. Exam Location:  Inpatient Procedure: 2D Echo, Cardiac Doppler and Color Doppler Indications:    TIA G45.9  History:        Patient has no prior history of Echocardiogram examinations.                 Pacemaker.  Sonographer:    Roosvelt Maser RDCS Referring Phys: [id number] VASUNDHRA RATHORE IMPRESSIONS  1. Left ventricular ejection fraction, by estimation, is 40 to 45%. The left ventricle has mildly decreased function. The left ventricle demonstrates global hypokinesis. The left ventricular internal cavity size was mildly dilated. Left ventricular diastolic parameters are consistent with Grade I diastolic dysfunction (impaired relaxation).  2.  Right ventricular systolic function is normal. The right ventricular size is normal.  3. The mitral valve is normal in structure. No evidence of mitral valve regurgitation. No evidence of mitral stenosis.  4. The aortic valve is normal in structure. Aortic valve regurgitation is trivial. No aortic stenosis is present.  5. There is mild dilatation of the aortic root, measuring 40 mm.  6. The inferior vena cava is dilated in size with >50% respiratory variability, suggesting right atrial pressure of 8 mmHg. Conclusion(s)/Recommendation(s): No intracardiac source of embolism detected on this transthoracic study. A transesophageal echocardiogram is recommended to exclude cardiac source of embolism if clinically indicated. FINDINGS  Left Ventricle: Left ventricular ejection fraction, by estimation, is 40 to 45%. The left ventricle has mildly decreased function. The left ventricle demonstrates global hypokinesis. 3D  left ventricular ejection fraction analysis performed but not reported based on interpreter judgement due to suboptimal quality. The left ventricular internal cavity size was mildly dilated. There is no left ventricular hypertrophy. Left ventricular diastolic parameters are consistent with Grade I diastolic dysfunction (impaired relaxation). Right Ventricle: The right ventricular size is normal. No increase in right ventricular wall thickness. Right ventricular systolic function is normal. Left Atrium: Left atrial size was normal in size. Right Atrium: Right atrial size was normal in size. Pericardium: There is no evidence of pericardial effusion. Mitral Valve: The mitral valve is normal in structure. No evidence of mitral valve regurgitation. No evidence of mitral valve stenosis. Tricuspid Valve: The tricuspid valve is normal in structure. Tricuspid valve regurgitation is mild . No evidence of tricuspid stenosis. Aortic Valve: The aortic valve is normal in structure. Aortic valve regurgitation is trivial. No aortic stenosis is present. Aortic valve mean gradient measures 2.0 mmHg. Aortic valve peak gradient measures 3.5 mmHg. Aortic valve area, by VTI measures 2.70 cm. Pulmonic Valve: The pulmonic valve was normal in structure. Pulmonic valve regurgitation is not visualized. No evidence of pulmonic stenosis. Aorta: The aortic root is normal in size and structure. There is mild dilatation of the aortic root, measuring 40 mm. Venous: The inferior vena cava is dilated in size with greater than 50% respiratory variability, suggesting right atrial pressure of 8 mmHg. IAS/Shunts: There is redundancy of the interatrial septum. No atrial level shunt detected by color flow Doppler. Additional Comments: A device lead is visualized in the right ventricle.  LEFT VENTRICLE PLAX 2D LVIDd:         5.40 cm      Diastology LVIDs:         4.30 cm      LV e' medial:    6.53 cm/s LV PW:         0.90 cm      LV E/e' medial:  4.5 LV  IVS:        1.00 cm      LV e' lateral:   12.10 cm/s LVOT diam:     2.20 cm      LV E/e' lateral: 2.4 LV SV:         56 LV SV Index:   24 LVOT Area:     3.80 cm                              3D Volume EF: LV Volumes (MOD)            3D EF:        49 % LV vol d, MOD A2C:  121.0 ml LV EDV:       138 ml LV vol d, MOD A4C: 145.0 ml LV ESV:       71 ml LV vol s, MOD A2C: 62.0 ml  LV SV:        67 ml LV vol s, MOD A4C: 81.2 ml LV SV MOD A2C:     59.0 ml LV SV MOD A4C:     145.0 ml LV SV MOD BP:      72.0 ml RIGHT VENTRICLE            IVC RV Basal diam:  3.90 cm    IVC diam: 2.50 cm RV S prime:     7.51 cm/s TAPSE (M-mode): 2.0 cm LEFT ATRIUM           Index        RIGHT ATRIUM           Index LA diam:      3.70 cm 1.57 cm/m   RA Area:     22.50 cm LA Vol (A2C): 64.4 ml 27.31 ml/m  RA Volume:   74.60 ml  31.64 ml/m  AORTIC VALVE AV Area (Vmax):    3.06 cm AV Area (Vmean):   2.92 cm AV Area (VTI):     2.70 cm AV Vmax:           93.00 cm/s AV Vmean:          64.200 cm/s AV VTI:            0.207 m AV Peak Grad:      3.5 mmHg AV Mean Grad:      2.0 mmHg LVOT Vmax:         74.90 cm/s LVOT Vmean:        49.300 cm/s LVOT VTI:          0.147 m LVOT/AV VTI ratio: 0.71  AORTA Ao Root diam: 4.00 cm MITRAL VALVE               TRICUSPID VALVE MV Area (PHT): 2.62 cm    TR Peak grad:   14.3 mmHg MV Decel Time: 289 msec    TR Vmax:        189.00 cm/s MV E velocity: 29.30 cm/s MV A velocity: 59.20 cm/s  SHUNTS MV E/A ratio:  0.49        Systemic VTI:  0.15 m                            Systemic Diam: 2.20 cm Donato Schultz MD Electronically signed by Donato Schultz MD Signature Date/Time: 09/03/2021/3:04:41 PM    Final    VAS [place] LOWER EXTREMITY VENOUS (DVT)  Result Date: 09/03/2021  Lower Venous DVT Study Patient Name:  Bobby Griffin  Date of Exam:   09/03/2021 Medical Rec #: [id number]     Accession #:    [id number] Date of Birth: July 19, 1956     Patient Gender: M Patient Age:   52 years Exam Location:  [facility] Procedure:       VAS [place] LOWER EXTREMITY VENOUS (DVT) Referring Phys: Angelique Blonder WOLFE --------------------------------------------------------------------------------  Indications: Stroke.  Comparison Study: no prior Performing Technologist: [place] Ponder RVS  Examination Guidelines: A complete evaluation includes B-mode imaging, spectral Doppler, color Doppler, and power Doppler as needed of all accessible portions of each vessel. Bilateral testing is considered an integral part of a complete examination. Limited examinations for reoccurring  indications may be performed as noted. The reflux portion of the exam is performed with the patient in reverse Trendelenburg.  +---------+---------------+---------+-----------+----------+--------------+ RIGHT    CompressibilityPhasicitySpontaneityPropertiesThrombus Aging +---------+---------------+---------+-----------+----------+--------------+ CFV      Full           Yes      Yes                                 +---------+---------------+---------+-----------+----------+--------------+ SFJ      Full                                                        +---------+---------------+---------+-----------+----------+--------------+ FV Prox  Full                                                        +---------+---------------+---------+-----------+----------+--------------+ FV Mid   Full                                                        +---------+---------------+---------+-----------+----------+--------------+ FV DistalFull                                                        +---------+---------------+---------+-----------+----------+--------------+ PFV      Full                                                        +---------+---------------+---------+-----------+----------+--------------+ POP      Full           Yes      Yes                                 +---------+---------------+---------+-----------+----------+--------------+  PTV      Full                                                        +---------+---------------+---------+-----------+----------+--------------+ PERO     Full                                                        +---------+---------------+---------+-----------+----------+--------------+   +---------+---------------+---------+-----------+----------+--------------+ LEFT     CompressibilityPhasicitySpontaneityPropertiesThrombus Aging +---------+---------------+---------+-----------+----------+--------------+ CFV      Full  Yes      Yes                                 +---------+---------------+---------+-----------+----------+--------------+ SFJ      Full                                                        +---------+---------------+---------+-----------+----------+--------------+ FV Prox  Full                                                        +---------+---------------+---------+-----------+----------+--------------+ FV Mid   Full                                                        +---------+---------------+---------+-----------+----------+--------------+ FV DistalFull                                                        +---------+---------------+---------+-----------+----------+--------------+ PFV      Full                                                        +---------+---------------+---------+-----------+----------+--------------+ POP      Full           Yes      Yes                                 +---------+---------------+---------+-----------+----------+--------------+ PTV      Full                                                        +---------+---------------+---------+-----------+----------+--------------+ PERO     Full                                                        +---------+---------------+---------+-----------+----------+--------------+     Summary: BILATERAL: - No evidence of deep  vein thrombosis seen in the lower extremities, bilaterally. -No evidence of popliteal cyst, bilaterally.   *See table(s) above for measurements and observations. Electronically signed by Waverly Ferrari MD on 09/03/2021 at 4:00:06 PM.    Final        Discharge Exam: Vitals:   09/06/21 0820 09/06/21 1228  BP: 110/81 107/87  Pulse: 64  66  Resp: 15 16  Temp: 98.1 F (36.7 C) 97.8 F (36.6 C)  SpO2: 97% 99%    General: Pt is alert, awake, not in acute distress Cardiovascular: RRR, S1/S2 +, no edema Respiratory: CTA bilaterally, no wheezing, no rhonchi, no respiratory distress, no conversational dyspnea  Abdominal: Soft, NT, ND, bowel sounds + Extremities: no edema, no cyanosis Psych: Normal mood and affect, stable judgement and insight     The results of significant diagnostics from this hospitalization (including imaging, microbiology, ancillary and laboratory) are listed below for reference.     Microbiology: Recent Results (from the past 240 hour(s))  Resp Panel by RT-PCR (Flu A&B, Covid) Nasopharyngeal Swab     Status: None   Collection Time: 09/02/21  9:42 PM   Specimen: Nasopharyngeal Swab; Nasopharyngeal(NP) swabs in vial transport medium  Result Value Ref Range Status   SARS Coronavirus 2 by RT PCR NEGATIVE NEGATIVE Final    Comment: (NOTE) SARS-CoV-2 target nucleic acids are NOT DETECTED.  The SARS-CoV-2 RNA is generally detectable in upper respiratory specimens during the acute phase of infection. The lowest concentration of SARS-CoV-2 viral copies this assay can detect is 138 copies/mL. A negative result does not preclude SARS-Cov-2 infection and should not be used as the sole basis for treatment or other patient management decisions. A negative result may occur with  improper specimen collection/handling, submission of specimen other than nasopharyngeal swab, presence of viral mutation(s) within the areas targeted by this assay, and inadequate number of  viral copies(<138 copies/mL). A negative result must be combined with clinical observations, patient history, and epidemiological information. The expected result is Negative.  Fact Sheet for Patients:  BloggerCourse.com  Fact Sheet for Healthcare Providers:  SeriousBroker.it  This test is no t yet approved or cleared by the [place] FDA and  has been authorized for detection and/or diagnosis of SARS-CoV-2 by FDA under an Emergency Use Authorization (EUA). This EUA will remain  in effect (meaning this test can be used) for the duration of the COVID-19 declaration under Section 564(b)(1) of the Act, 21 U.S.C.section 360bbb-3(b)(1), unless the authorization is terminated  or revoked sooner.       Influenza A by PCR NEGATIVE NEGATIVE Final   Influenza B by PCR NEGATIVE NEGATIVE Final    Comment: (NOTE) The Xpert Xpress SARS-CoV-2/FLU/RSV plus assay is intended as an aid in the diagnosis of influenza from Nasopharyngeal swab specimens and should not be used as a sole basis for treatment. Nasal washings and aspirates are unacceptable for Xpert Xpress SARS-CoV-2/FLU/RSV testing.  Fact Sheet for Patients: BloggerCourse.com  Fact Sheet for Healthcare Providers: SeriousBroker.it  This test is not yet approved or cleared by the [place] FDA and has been authorized for detection and/or diagnosis of SARS-CoV-2 by FDA under an Emergency Use Authorization (EUA). This EUA will remain in effect (meaning this test can be used) for the duration of the COVID-19 declaration under Section 564(b)(1) of the Act, 21 U.S.C. section 360bbb-3(b)(1), unless the authorization is terminated or revoked.  Performed at [facility], [address] [place], [place] [address]      Labs: BNP (last 3 results) No results for input(s): BNP in the last 8760 hours. Basic Metabolic  Panel: Recent Labs  Lab 09/02/21 2153 09/02/21 2212 09/04/21 1236  NA 137 140  --   K 4.2 4.1  --   CL 104 105  --   CO2 25  --   --   GLUCOSE 107* 94  --  BUN 22 24*  --   CREATININE 1.29* 1.10  --   CALCIUM 9.9  --   --   MG  --   --  1.8   Liver Function Tests: Recent Labs  Lab 09/02/21 2153  AST 25  ALT 30  ALKPHOS 73  BILITOT 1.3*  PROT 6.9  ALBUMIN 3.7   No results for input(s): LIPASE, AMYLASE in the last 168 hours. No results for input(s): AMMONIA in the last 168 hours. CBC: Recent Labs  Lab 09/02/21 2153 09/02/21 2212  WBC 5.3  --   NEUTROABS 2.9  --   HGB 16.3 16.7  HCT 49.6 49.0  MCV 91.5  --   PLT 171  --    Cardiac Enzymes: No results for input(s): CKTOTAL, CKMB, CKMBINDEX, TROPONINI in the last 168 hours. BNP: Invalid input(s): POCBNP CBG: Recent Labs  Lab 09/02/21 2130 09/03/21 0703 09/03/21 1233  GLUCAP 109* 133* 150*   D-Dimer No results for input(s): DDIMER in the last 72 hours. Hgb A1c No results for input(s): HGBA1C in the last 72 hours. Lipid Profile No results for input(s): CHOL, HDL, LDLCALC, TRIG, CHOLHDL, LDLDIRECT in the last 72 hours. Thyroid function studies No results for input(s): TSH, T4TOTAL, T3FREE, THYROIDAB in the last 72 hours.  Invalid input(s): FREET3 Anemia work up No results for input(s): VITAMINB12, FOLATE, FERRITIN, TIBC, IRON, RETICCTPCT in the last 72 hours. Urinalysis    Component Value Date/Time   COLORURINE YELLOW 09/02/2021 2201   APPEARANCEUR CLEAR 09/02/2021 2201   LABSPEC 1.028 09/02/2021 2201   PHURINE 5.0 09/02/2021 2201   GLUCOSEU >=500 (A) 09/02/2021 2201   HGBUR NEGATIVE 09/02/2021 2201   BILIRUBINUR NEGATIVE 09/02/2021 2201   KETONESUR 5 (A) 09/02/2021 2201   PROTEINUR NEGATIVE 09/02/2021 2201   NITRITE NEGATIVE 09/02/2021 2201   LEUKOCYTESUR NEGATIVE 09/02/2021 2201   Sepsis Labs Invalid input(s): PROCALCITONIN,  WBC,  LACTICIDVEN Microbiology Recent Results (from the past 240  hour(s))  Resp Panel by RT-PCR (Flu A&B, Covid) Nasopharyngeal Swab     Status: None   Collection Time: 09/02/21  9:42 PM   Specimen: Nasopharyngeal Swab; Nasopharyngeal(NP) swabs in vial transport medium  Result Value Ref Range Status   SARS Coronavirus 2 by RT PCR NEGATIVE NEGATIVE Final    Comment: (NOTE) SARS-CoV-2 target nucleic acids are NOT DETECTED.  The SARS-CoV-2 RNA is generally detectable in upper respiratory specimens during the acute phase of infection. The lowest concentration of SARS-CoV-2 viral copies this assay can detect is 138 copies/mL. A negative result does not preclude SARS-Cov-2 infection and should not be used as the sole basis for treatment or other patient management decisions. A negative result may occur with  improper specimen collection/handling, submission of specimen other than nasopharyngeal swab, presence of viral mutation(s) within the areas targeted by this assay, and inadequate number of viral copies(<138 copies/mL). A negative result must be combined with clinical observations, patient history, and epidemiological information. The expected result is Negative.  Fact Sheet for Patients:  BloggerCourse.com  Fact Sheet for Healthcare Providers:  SeriousBroker.it  This test is no t yet approved or cleared by the [place] FDA and  has been authorized for detection and/or diagnosis of SARS-CoV-2 by FDA under an Emergency Use Authorization (EUA). This EUA will remain  in effect (meaning this test can be used) for the duration of the COVID-19 declaration under Section 564(b)(1) of the Act, 21 U.S.C.section 360bbb-3(b)(1), unless the authorization is terminated  or revoked sooner.  Influenza A by PCR NEGATIVE NEGATIVE Final   Influenza B by PCR NEGATIVE NEGATIVE Final    Comment: (NOTE) The Xpert Xpress SARS-CoV-2/FLU/RSV plus assay is intended as an aid in the diagnosis of influenza from  Nasopharyngeal swab specimens and should not be used as a sole basis for treatment. Nasal washings and aspirates are unacceptable for Xpert Xpress SARS-CoV-2/FLU/RSV testing.  Fact Sheet for Patients: BloggerCourse.com  Fact Sheet for Healthcare Providers: SeriousBroker.it  This test is not yet approved or cleared by the [place] FDA and has been authorized for detection and/or diagnosis of SARS-CoV-2 by FDA under an Emergency Use Authorization (EUA). This EUA will remain in effect (meaning this test can be used) for the duration of the COVID-19 declaration under Section 564(b)(1) of the Act, 21 U.S.C. section 360bbb-3(b)(1), unless the authorization is terminated or revoked.  Performed at [facility], [address] [place], [place] [address]      Patient was seen and examined on the day of discharge and was found to be in stable condition. Time coordinating discharge: 25 minutes including assessment and coordination of care, as well as examination of the patient.   SIGNED:  Noralee Stain, DO [facility] 09/06/2021, 8:25 PM
